# Patient Record
Sex: Male | Born: 2000 | Race: Black or African American | Hispanic: No | Marital: Single | Smoking: Never smoker
Health system: Southern US, Community
[De-identification: ages and names within clinical notes are randomized; demographics above are authoritative.]

## PROBLEM LIST (undated history)

## (undated) DIAGNOSIS — Z8709 Personal history of other diseases of the respiratory system: Secondary | ICD-10-CM

## (undated) DIAGNOSIS — S83511A Sprain of anterior cruciate ligament of right knee, initial encounter: Secondary | ICD-10-CM

---
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
## 2022-09-23 ENCOUNTER — Encounter (HOSPITAL_BASED_OUTPATIENT_CLINIC_OR_DEPARTMENT_OTHER): Payer: Self-pay | Admitting: Urology

## 2022-09-23 ENCOUNTER — Emergency Department (HOSPITAL_BASED_OUTPATIENT_CLINIC_OR_DEPARTMENT_OTHER)
Admission: EM | Admit: 2022-09-23 | Discharge: 2022-09-23 | Disposition: A | Discharge status: Home / Self Care | Payer: Medicaid Other | Attending: Emergency Medicine | Admitting: Emergency Medicine

## 2022-09-23 ENCOUNTER — Emergency Department (HOSPITAL_BASED_OUTPATIENT_CLINIC_OR_DEPARTMENT_OTHER): Payer: Medicaid Other

## 2022-09-23 ENCOUNTER — Other Ambulatory Visit: Payer: Self-pay

## 2022-09-23 DIAGNOSIS — Z5321 Procedure and treatment not carried out due to patient leaving prior to being seen by health care provider: Secondary | ICD-10-CM | POA: Diagnosis not present

## 2022-09-23 DIAGNOSIS — R1013 Epigastric pain: Secondary | ICD-10-CM | POA: Insufficient documentation

## 2022-09-23 DIAGNOSIS — E876 Hypokalemia: Secondary | ICD-10-CM | POA: Diagnosis not present

## 2022-09-23 LAB — COMPREHENSIVE METABOLIC PANEL
ALT: 21 U/L (ref 0–44)
AST: 30 U/L (ref 15–41)
Albumin: 4.8 g/dL (ref 3.5–5.0)
Alkaline Phosphatase: 55 U/L (ref 38–126)
Anion gap: 14 (ref 5–15)
BUN: 10 mg/dL (ref 6–20)
CO2: 20 mmol/L — ABNORMAL LOW (ref 22–32)
Calcium: 9.8 mg/dL (ref 8.9–10.3)
Chloride: 104 mmol/L (ref 98–111)
Creatinine, Ser: 1.15 mg/dL (ref 0.61–1.24)
GFR, Estimated: >60 mL/min (ref >60)
Glucose, Bld: 128 mg/dL — ABNORMAL HIGH (ref 70–99)
Potassium: 3.3 mmol/L — ABNORMAL LOW (ref 3.5–5.1)
Sodium: 138 mmol/L (ref 135–145)
Total Bilirubin: 0.7 mg/dL (ref 0.3–1.2)
Total Protein: 7.8 g/dL (ref 6.5–8.1)

## 2022-09-23 LAB — CBC
HCT: 42.7 % (ref 39.0–52.0)
Hemoglobin: 14.2 g/dL (ref 13.0–17.0)
MCH: 28 pg (ref 26.0–34.0)
MCHC: 33.3 g/dL (ref 30.0–36.0)
MCV: 84.1 fL (ref 80.0–100.0)
Platelets: 285 10*3/uL (ref 150–400)
RBC: 5.08 MIL/uL (ref 4.22–5.81)
RDW: 12.8 % (ref 11.5–15.5)
WBC: 11.3 10*3/uL — ABNORMAL HIGH (ref 4.0–10.5)
nRBC: 0 % (ref 0.0–0.2)

## 2022-09-23 LAB — URINALYSIS, MICROSCOPIC (REFLEX)

## 2022-09-23 LAB — URINALYSIS, ROUTINE W REFLEX MICROSCOPIC
Bilirubin Urine: NEGATIVE
Glucose, UA: NEGATIVE mg/dL
Hgb urine dipstick: NEGATIVE
Ketones, ur: >=80 mg/dL — AB
Leukocytes,Ua: NEGATIVE
Nitrite: NEGATIVE
Protein, ur: 100 mg/dL — AB
Specific Gravity, Urine: 1.02 (ref 1.005–1.030)
pH: >=9 (ref 5.0–8.0)

## 2022-09-23 LAB — MAGNESIUM: Magnesium: 1.5 mg/dL — ABNORMAL LOW (ref 1.7–2.4)

## 2022-09-23 LAB — LIPASE, BLOOD: Lipase: 26 U/L (ref 11–51)

## 2022-09-23 LAB — TROPONIN I (HIGH SENSITIVITY): Troponin I (High Sensitivity): 3 ng/L (ref <18)

## 2022-09-23 MED ORDER — HALOPERIDOL LACTATE 5 MG/ML IJ SOLN
2.0000 mg | Freq: Once | INTRAMUSCULAR | Status: AC
  Administered 2022-09-23: 2 mg via INTRAVENOUS
  Filled 2022-09-23: qty 1

## 2022-09-23 MED ORDER — POTASSIUM CHLORIDE CRYS ER 20 MEQ PO TBCR: 20.0000 meq | EXTENDED_RELEASE_TABLET | Freq: Once | ORAL | Status: DC

## 2022-09-23 MED ORDER — LACTATED RINGERS IV BOLUS
2000.0000 mL | Freq: Once | INTRAVENOUS | Status: AC
  Administered 2022-09-23: 2000 mL via INTRAVENOUS

## 2022-09-23 MED ORDER — CAPSAICIN 0.075 % EX CREA
TOPICAL_CREAM | Freq: Once | CUTANEOUS | Status: AC
  Administered 2022-09-23: 1 via TOPICAL
  Filled 2022-09-23: qty 60

## 2022-09-23 MED ORDER — POTASSIUM CHLORIDE 10 MEQ/100ML IV SOLN
10.0000 meq | INTRAVENOUS | Status: DC
  Administered 2022-09-23: 10 meq via INTRAVENOUS
  Filled 2022-09-23 (×2): qty 100

## 2022-09-23 MED ORDER — MAGNESIUM OXIDE -MG SUPPLEMENT 400 (240 MG) MG PO TABS: 400.0000 mg | ORAL_TABLET | Freq: Once | ORAL | Status: DC

## 2022-09-23 NOTE — ED Provider Notes (Signed)
I provided a substantive portion of the care of this patient.  I personally made/approved the management plan for this patient and take responsibility for the patient management.     Patient reports he had sudden onset of vomiting and abdominal pain this morning.  He reports that it hurts a lot in the central upper abdomen.  He reports he has vomited many times this morning.  Patient reports he does smoke marijuana pretty frequently.  He denies prior episode of similar symptoms.  On initial arrival patient was reportedly hyperventilating and very anxious.  At the time of my evaluation he is more calm and answering questions appropriately.  No evidence of respiratory distress.  Heart regular.  Lungs clear to auscultation.  Abdomen diffusely tender but no guarding.  No peripheral edema.  Labs reviewed show normal GFR.  Glucose 128.  Potassium 3.3.  Betker count 11.3.  At this time based on physical exam and history of symptoms I have most likely suspect cannabinoid hyperemesis and will start with aggressive rehydration, Haldol 2 mg and capsaicin to the abdomen.  Patient educated on cannabinoid hyperemesis symptoms and treatment.   Arby Barrette, MD 09/23/22 [phone]

## 2022-09-23 NOTE — ED Notes (Signed)
Pt states he is ready to go ,PA into speak to pt and make him aware of  leaving AMA, found pt standing on end of bed getting dressed IV had been unattached to saline lockand was running ito bed

## 2022-09-23 NOTE — ED Notes (Signed)
Pt coached on breathing techniques.

## 2022-09-23 NOTE — ED Notes (Signed)
Called lab, will add on Mag

## 2022-09-23 NOTE — ED Provider Notes (Signed)
[place] EMERGENCY DEPARTMENT AT [facility] Provider Note   CSN: [id number] Arrival date & time: 09/23/22  1201     History Chief Complaint  Patient presents with   Abdominal Pain    Nakoda Collard is a 22 y.o. male reportedly otherwise healthy presents emerged from today for evaluation of abdominal pain with nausea and vomiting.  Patient reports this started today around 0900.  He had Tylenol PM for the symptoms but did not have any improvement.  He has had nausea with 2 episodes of emesis.  Reports the abdominal pain is mainly epigastric and achy.  Denies any diarrhea or constipation.  Had a normal bowel movement this morning without any melena or hematochezia.  Denies any chest pain reports some shortness of breath.  Denies any cough, runny nose, nasal gesturing, or any fever.  He denies any dysuria or hematuria as well.  He denies any radiation of pain into his testicles or scrotum or penis.  He reports surgical history to his knee but denies any surgical history to his abdomen.  No daily medications.  No known drug allergies.  Denies any tobacco or EtOH use. Almost daily marijuana use.   Abdominal Pain Associated symptoms: nausea, shortness of breath and vomiting   Associated symptoms: no chest pain, no chills, no constipation, no cough, no diarrhea, no dysuria, no fever and no hematuria        Home Medications Prior to Admission medications   Medication Sig Start Date End Date Taking? Authorizing Provider  HYDROcodone-acetaminophen (NORCO) 5-325 MG tablet Take 1-2 tablets by mouth every 6 (six) hours as needed for moderate pain. Patient not taking: Reported on 06/22/2018 02/19/17   Marshia Ly, PA-C      Allergies    Patient has no known allergies.    Review of Systems   Review of Systems  Constitutional:  Negative for chills and fever.  HENT:  Negative for congestion and rhinorrhea.   Respiratory:  Positive for shortness of breath. Negative for cough.    Cardiovascular:  Negative for chest pain.  Gastrointestinal:  Positive for abdominal pain, nausea and vomiting. Negative for blood in stool, constipation and diarrhea.  Genitourinary:  Negative for dysuria, hematuria, penile discharge, penile pain, penile swelling, scrotal swelling and testicular pain.    Physical Exam Updated Vital Signs BP (!) 128/54   Pulse 77   Temp 99.4 F (37.4 C) (Rectal)   Resp 18   Ht 5\' 8"  (1.727 m)   Wt 59 kg   SpO2 100%   BMI 19.78 kg/m  Physical Exam Vitals and nursing note reviewed.  Constitutional:      Appearance: He is not toxic-appearing.     Comments: Speaking in prolonged full sentences without needing to take a breath however would pause after sentence, breath, and then hyperventilate and continue another full sentence without any breaks or signs of respiratory distress.   HENT:     Mouth/Throat:     Mouth: Mucous membranes are moist.  Eyes:     General: No scleral icterus. Cardiovascular:     Rate and Rhythm: Normal rate.  Pulmonary:     Effort: Pulmonary effort is normal. No respiratory distress.     Breath sounds: Normal breath sounds. No wheezing.  Abdominal:     General: There is no distension.     Palpations: Abdomen is soft.     Tenderness: There is abdominal tenderness in the epigastric area. There is no guarding or rebound.  Skin:  General: Skin is warm.  Neurological:     Mental Status: He is alert.     ED Results / Procedures / Treatments   Labs (all labs ordered are listed, but only abnormal results are displayed) Labs Reviewed  COMPREHENSIVE METABOLIC PANEL - Abnormal; Notable for the following components:      Result Value   Potassium 3.3 (*)    CO2 20 (*)    Glucose, Bld 128 (*)    All other components within normal limits  CBC - Abnormal; Notable for the following components:   WBC 11.3 (*)    All other components within normal limits  URINALYSIS, ROUTINE W REFLEX MICROSCOPIC - Abnormal; Notable for the  following components:   Ketones, ur >=80 (*)    Protein, ur 100 (*)    All other components within normal limits  MAGNESIUM - Abnormal; Notable for the following components:   Magnesium 1.5 (*)    All other components within normal limits  URINALYSIS, MICROSCOPIC (REFLEX) - Abnormal; Notable for the following components:   Bacteria, UA RARE (*)    All other components within normal limits  LIPASE, BLOOD  TROPONIN I (HIGH SENSITIVITY)  TROPONIN I (HIGH SENSITIVITY)    EKG None  Radiology DG Chest 2 View  Result Date: 09/23/2022 CLINICAL DATA:  Shortness of breath with nausea vomiting. EXAM: CHEST - 2 VIEW COMPARISON:  None Available. FINDINGS: The lungs are clear without focal pneumonia, edema, pneumothorax or pleural effusion. The cardiopericardial silhouette is within normal limits for size. No acute bony abnormality. IMPRESSION: No active cardiopulmonary disease. Electronically Signed   By: Kennith Center M.D.   On: 09/23/2022 15:36    Procedures Procedures   Medications Ordered in ED Medications  potassium chloride SA (KLOR-CON M) CR tablet 20 mEq (20 mEq Oral Not Given 09/23/22 1615)  magnesium oxide (MAG-OX) tablet 400 mg (400 mg Oral Not Given 09/23/22 1616)  lactated ringers bolus 2,000 mL (0 mLs Intravenous Stopped 09/23/22 1617)  haloperidol lactate (HALDOL) injection 2 mg (2 mg Intravenous Given 09/23/22 1528)  capsicum (ZOSTRIX) 0.075 % cream (1 Application Topical Given 09/23/22 1533)    ED Course/ Medical Decision Making/ A&P    Medical Decision Making Amount and/or Complexity of Data Reviewed Labs: ordered. Radiology: ordered.  Risk OTC drugs. Prescription drug management.   22 y.o. male presents to the ER for evaluation of abdominal pain, nausea, vomiting, and shortness of breath. Differential diagnosis includes but is not limited to ACS/MI, Boerhaave's, DKA, elevated ICP, Ischemic bowel, Sepsis, Drug-related (toxicity, THC hyperemesis, ETOH, withdrawal),  Appendicitis, Bowel obstruction, Electrolyte abnormalities, Pancreatitis, Biliary colic, Gastroenteritis, Gastroparesis, Hepatitis, Migraine, Thyroid disease, Renal colic, GERD/PUD, UTI, CHF, pericardial effusion/tamponade, arrhythmias, ACS, COPD, asthma, bronchitis, pneumonia, pneumothorax, PE, anemia. Vital signs BP at 128/54 otherwise unremarkable. Physical exam as noted above.   Patient complaining of SOB when in triage.  Respiratory therapist assessed at bedside.  Likely anxiety as lungs are clear.  He was speaking in full sentences and would become tachypneic and speaking full sentences again.  Medical professionals were not in the room, he was evaluated on cardiac monitoring with normal respiratory rate, oxygen, and pulse ox.  Whenever medical professionals would go into the room, he started to have the tachypnea.  He was encouraged multiple times by myself and by nursing staff on breathing techniques.  His lung sounds are clear, heart regular rate and rhythm.  Denies any chest pain.  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  Ordered Haldol, capsaicin, and LR.  EKG was reviewed and interpreted by my attending.  I independently reviewed and interpreted the patient's labs.  CMP shows decreased potassium 3 of 3, bicarb of 20 with a normal anion gap.  Glucose at 128.  No other electrolyte or LFT abnormality.  CBC does show slight increase in Kucinski blood cell count 11.3.  No anemia.  Lipase within normal limits.  Urinalysis shows concentrated urine with greater than 80 ketones and 100 protein present.  Some rare bacteria however no other signs of infection.  Magnesium low at 1.5.  Troponin at 3.  Chest x-ray shows no acute cardiopulmonary process per radiology read.  Was informed by nursing staff the patient was feeling better and wanted to go home.  He is no longer shaking or having any tachypnea.  His fluids are still running and he is only received 1 of  potassium.  I discussed with him that his potassium and magnesium are low and this is dangerous and he needs to stay to receive the potassium magnesium.  Patient reports that he was amenable to receive these.  I left the room to order them p.o. however the patient eloped.  I discussed this case with my attending physician who cosigned this note including patient's presenting symptoms, physical exam, and planned diagnostics and interventions. Attending physician stated agreement with plan or made changes to plan which were implemented.   Attending physician assessed patient at bedside.   Final Clinical Impression(s) / ED Diagnoses Final diagnoses:  Eloped from emergency department  Hypokalemia  Hypomagnesemia    Rx / DC Orders ED Discharge Orders     None         Achille Rich, PA-C 09/23/22 1845    Arby Barrette, MD 09/25/22 [phone]

## 2022-09-23 NOTE — ED Notes (Signed)
Report rec'd from prev RN 

## 2022-09-23 NOTE — ED Notes (Signed)
Pt denies any exposure to anyone with a known illness, reports smoking marijuana occasionally, but denies any other drug use; PA-C notified about temp increase; pt given blankets and heat packs by another RN, OK to warm pt per provider

## 2022-09-23 NOTE — ED Notes (Signed)
Pt states is not staying for any meds young woman with pt

## 2022-09-23 NOTE — ED Triage Notes (Signed)
Pt states Nausea and abdominal pain  States SOB that started with the pain  Pt very tachypnic, hyperventilating   Brett Canales, RT at bedside  No h/o asthma

## 2023-06-01 ENCOUNTER — Emergency Department (HOSPITAL_COMMUNITY)
Admission: EM | Admit: 2023-06-01 | Discharge: 2023-06-01 | Discharge status: Against Medical Advice | Payer: MEDICAID | Attending: Emergency Medicine | Admitting: Emergency Medicine

## 2023-06-01 ENCOUNTER — Encounter (HOSPITAL_COMMUNITY): Payer: Self-pay

## 2023-06-01 ENCOUNTER — Emergency Department (HOSPITAL_COMMUNITY): Payer: MEDICAID

## 2023-06-01 DIAGNOSIS — Z5321 Procedure and treatment not carried out due to patient leaving prior to being seen by health care provider: Secondary | ICD-10-CM | POA: Insufficient documentation

## 2023-06-01 DIAGNOSIS — R112 Nausea with vomiting, unspecified: Secondary | ICD-10-CM | POA: Diagnosis not present

## 2023-06-01 DIAGNOSIS — R1010 Upper abdominal pain, unspecified: Secondary | ICD-10-CM | POA: Insufficient documentation

## 2023-06-01 DIAGNOSIS — R197 Diarrhea, unspecified: Secondary | ICD-10-CM | POA: Insufficient documentation

## 2023-06-01 LAB — COMPREHENSIVE METABOLIC PANEL
ALT: 26 U/L (ref 0–44)
AST: 34 U/L (ref 15–41)
Albumin: 4.4 g/dL (ref 3.5–5.0)
Alkaline Phosphatase: 47 U/L (ref 38–126)
Anion gap: 11 (ref 5–15)
BUN: 5 mg/dL — ABNORMAL LOW (ref 6–20)
CO2: 21 mmol/L — ABNORMAL LOW (ref 22–32)
Calcium: 10 mg/dL (ref 8.9–10.3)
Chloride: 107 mmol/L (ref 98–111)
Creatinine, Ser: 1.12 mg/dL (ref 0.61–1.24)
GFR, Estimated: >60 mL/min (ref >60)
Glucose, Bld: 131 mg/dL — ABNORMAL HIGH (ref 70–99)
Potassium: 3.9 mmol/L (ref 3.5–5.1)
Sodium: 139 mmol/L (ref 135–145)
Total Bilirubin: 0.6 mg/dL (ref 0.0–1.2)
Total Protein: 7.4 g/dL (ref 6.5–8.1)

## 2023-06-01 LAB — CBC
HCT: 43.4 % (ref 39.0–52.0)
Hemoglobin: 14.4 g/dL (ref 13.0–17.0)
MCH: 28.3 pg (ref 26.0–34.0)
MCHC: 33.2 g/dL (ref 30.0–36.0)
MCV: 85.3 fL (ref 80.0–100.0)
Platelets: 308 10*3/uL (ref 150–400)
RBC: 5.09 MIL/uL (ref 4.22–5.81)
RDW: 13.4 % (ref 11.5–15.5)
WBC: 8 10*3/uL (ref 4.0–10.5)
nRBC: 0 % (ref 0.0–0.2)

## 2023-06-01 LAB — RESP PANEL BY RT-PCR (RSV, FLU A&B, COVID)  RVPGX2
Influenza A by PCR: NEGATIVE
Influenza B by PCR: NEGATIVE
Resp Syncytial Virus by PCR: NEGATIVE
SARS Coronavirus 2 by RT PCR: NEGATIVE

## 2023-06-01 LAB — LIPASE, BLOOD: Lipase: 23 U/L (ref 11–51)

## 2023-06-01 MED ORDER — IOHEXOL 350 MG/ML SOLN
75.0000 mL | Freq: Once | INTRAVENOUS | Status: AC | PRN
  Administered 2023-06-01: 75 mL via INTRAVENOUS

## 2023-06-01 MED ORDER — ONDANSETRON HCL 4 MG/2ML IJ SOLN
4.0000 mg | Freq: Once | INTRAMUSCULAR | Status: AC
  Administered 2023-06-01: 4 mg via INTRAVENOUS
  Filled 2023-06-01: qty 2

## 2023-06-01 NOTE — ED Notes (Signed)
Pt called for room x3 with no answer. 

## 2023-06-01 NOTE — ED Triage Notes (Signed)
 Pt c/o generalized abdominal pain and n/v/d starting this morning.  Pain score 8/10.

## 2023-06-01 NOTE — ED Provider Triage Note (Signed)
 Emergency Medicine Provider Triage Evaluation Note  Bobby Morris , a 23 y.o. male  was evaluated in triage.  Pt complains of abdominal pain.  Patient reports upper abdominal pain that began this morning.  It woke him up out of sleep.  Since then he has persistent pain, with nausea, vomiting, and diarrhea.  Denies any recent alcohol or marijuana use.  No new foods.  No fevers at home.  Review of Systems  Positive: Nausea, vomiting, abdominal pain Negative: Fevers  Physical Exam  BP (!) 127/106 (BP Location: Right Arm)   Pulse 86   Temp 99.1 F (37.3 C)   Resp (!) 32   Ht 5\' 8"  (1.727 m)   Wt 59 kg   SpO2 100%   BMI 19.77 kg/m  Gen:   Awake, anxious appearing Resp:  Normal effort  MSK:   Moves extremities without difficulty  Other:  Patient is tremulous on exam  Medical Decision Making  Medically screening exam initiated at 2:24 PM.  Appropriate orders placed.  Bobby Morris was informed that the remainder of the evaluation will be completed by another provider, this initial triage assessment does not replace that evaluation, and the importance of remaining in the ED until their evaluation is complete.     Bobby Marion, PA-C 06/01/23 1424

## 2023-06-04 ENCOUNTER — Ambulatory Visit (HOSPITAL_COMMUNITY)
Admission: EM | Admit: 2023-06-04 | Discharge: 2023-06-04 | Discharge status: Against Medical Advice | Attending: Psychiatry | Admitting: Psychiatry

## 2023-06-04 DIAGNOSIS — F411 Generalized anxiety disorder: Secondary | ICD-10-CM | POA: Insufficient documentation

## 2023-06-04 NOTE — ED Notes (Signed)
 Per triage, patient left AMA due to not wanting to stay.

## 2023-06-04 NOTE — ED Provider Notes (Signed)
 Behavioral Health Urgent Care Medical Screening Exam  Patient Name: Bobby Morris MRN: [id number] Date of Evaluation: 06/04/23 Chief Complaint:   Diagnosis:  Final diagnoses:  Anxiety state    History of Present illness: Bobby Morris is a 23 y.o. male.   Patient presents voluntarily, accompanied by his mother  Perry Brucato [phone]. Patient reports that he has been experiencing anxiety and panic attacks since Sunday. Per chart review, patient has had many visits to ED lately, complaining of panic attacks accompanied with nausea and vomiting.  On 09/23/2022, patient presented to [facility] ED complaining of  shortness of breath, abdominal discomfort, characterized by nausea and vomiting along with abdominal pain. He admitted to daily marijuana use.  Patient eloped from the facility. On 12/23/2022, patient was seen at [facility] for the same symptoms. On 12/24/2022, patient visited [facility] again for panic attacks. On 06/01/2023, patient visited [facility] with chief complaint of N/V.  Today, patient presents with the above symptoms and reports that his anxiety is becoming complicated.  He denies SI/HI/AVH.  He admits to using Marijuana and last use was Saturday.   Assessment: 23 year-old male looking underweight. He appears to be uncomfortable and frequently spitting in the trash can. He is cooperative upon approach. He is anxious and restless. He appears to be underweight and reports that he has lost about 10 lbs since Sunday due to vomiting and not eating enough.  Patient is alert and oriented x 4. His thought process is coherent and goal-directed.  Patient denies suicidal ideations. He denies HI. He reports a hx of hallucinations: heard condensed voices about a year ago when his friend died.  He reports that he has not been ale to keep anything down due to frequent vomiting. Reports that he has lost about 10 lbs since Sunday. He reports that he feels burning sensations after vomiting.  He reports  that he uses Marijuana "because I thought it was going to make it better".   Patient reports a family hx of anxiety and panic attacks: his paternal grandmother experiences panic attacks. He reports no additional family hx of mental illness.   Patient presents with  a  hx of traumatic events.  Patient has never met his biological father; was raised by step dad since age 88. In 2020, during Covid19, patient was told that his father went missing and was found dead in a river. He never knew what killed him and his case has been closed. Patient also lost his uncle who played a role in his life as a dad. He lost  cousin as well.    Patient reports that "I feel lost". He reports that his mother and step-father are supportive. He reports that he has no therapist and has never taken any anxiety medications. He reports that he is interested in starting therapy  once stabilized. Patient is educated on the effects of a long term use of Marijuana. He is encouraged to utilize alternative coping skills  and to process his feelings with therapist.    After consulting with Dr Enedina Finner, admission to observation unit was recommended. However, patient walked out reporting that "I am not ready, I will have to come back". Patient was encouraged to stay for overnight monitoring to address his anxiety but refused to stay. He refused referrals to outpatient services.      Flowsheet Row ED from 06/04/2023 in [facility] ED from 06/01/2023 in [facility] Emergency Department at [facility] ED from 09/23/2022 in  [facility] Emergency Department at [facility]  C-SSRS RISK CATEGORY No Risk No Risk No Risk       Psychiatric Specialty Exam  Presentation  General Appearance:Casual  Eye Contact:Fair  Speech:Clear and Coherent  Speech Volume:Normal  Handedness:Right   Mood and Affect  Mood: Anxious  Affect: Depressed   Thought Process  Thought  Processes: Coherent  Descriptions of Associations:Intact  Orientation:Full (Time, Place and Person)  Thought Content:Logical    Hallucinations:None  Ideas of Reference:None  Suicidal Thoughts:No  Homicidal Thoughts:No   Sensorium  Memory: Immediate Fair; Recent Fair; Remote Fair  Judgment: Fair  Insight: Fair   Art therapist  Concentration: Fair  Attention Span: Fair  Recall: [employer] of Knowledge: Fair  Language: Fair   Psychomotor Activity  Psychomotor Activity: Restlessness   Assets  Assets: Manufacturing systems engineer; Desire for Improvement; Social Support   Sleep  Sleep: Poor  Number of hours:  2   Physical Exam: Physical Exam Constitutional:      Appearance: Normal appearance. He is ill-appearing.  HENT:     Head: Normocephalic and atraumatic.     Right Ear: Tympanic membrane normal.     Left Ear: Tympanic membrane normal.     Nose: Nose normal.  Eyes:     Extraocular Movements: Extraocular movements intact.     Pupils: Pupils are equal, round, and reactive to light.  Cardiovascular:     Rate and Rhythm: Normal rate.     Pulses: Normal pulses.  Pulmonary:     Effort: Pulmonary effort is normal.  Musculoskeletal:        General: Normal range of motion.     Cervical back: Normal range of motion and neck supple.  Neurological:     General: No focal deficit present.     Mental Status: He is alert and oriented to person, place, and time.  Psychiatric:        Thought Content: Thought content normal.    Review of Systems  Constitutional: Negative.   HENT: Negative.    Eyes: Negative.   Respiratory: Negative.    Cardiovascular: Negative.   Gastrointestinal: Negative.   Genitourinary: Negative.   Musculoskeletal: Negative.   Skin: Negative.   Neurological: Negative.   Endo/Heme/Allergies: Negative.   Psychiatric/Behavioral:  The patient is nervous/anxious.    Blood pressure (!) 106/92, pulse 92, temperature 97.9 F  (36.6 C), temperature source Oral, resp. rate 17, SpO2 100%. There is no height or weight on file to calculate BMI.  Musculoskeletal: Strength & Muscle Tone: within normal limits Gait & Station: normal Patient leans: N/A   [facility] MSE Discharge Disposition for Follow up and Recommendations: Based on my evaluation the patient does not appear to have an emergency medical condition and can be discharged with resources and follow up care in outpatient services for Medication Management, Substance Abuse Intensive Outpatient Program, Individual Therapy, and Group Therapy   Olin Pia, NP 06/04/2023, 12:18 PM

## 2023-06-04 NOTE — Progress Notes (Signed)
   06/04/23 1118  [facility] Triage Screening (Walk-ins at [facility] only)  How Did You Hear About [place]? Family/Friend  What Is the Reason for Your Visit/Call Today? Bobby Morris presents to [facility] voluntarily accompanied by his mother. Pt states that he has had a panic attack since Sunday and has been unable to eat or hold down any food. Pt states that he has been drinking water and that has been staying down. Pt states that he had pain around his heart and stomach which also started on Sunday. Pt states that his hands will start cramping up and his body will cramp up when he has anxiety attacks. Pt currently denies SI, HI, AVH and alcohol/drug use. Pt states that he would just like to sleep at night and hold some food down.  How Long Has This Been Causing You Problems? <Week  Have You Recently Had Any Thoughts About Hurting Yourself? No  Are You Planning to Commit Suicide/Harm Yourself At This time? No  Have you Recently Had Thoughts About Hurting Someone [name]? No  Are You Planning To Harm Someone At This Time? No  Physical Abuse Denies  Verbal Abuse Denies  Sexual Abuse Denies  Exploitation of patient/patient's resources Denies  Self-Neglect Denies  Are you currently experiencing any auditory, visual or other hallucinations? No  Have You Used Any Alcohol or Drugs in the Past 24 Hours? No  Do you have any current medical co-morbidities that require immediate attention? Yes  Please describe current medical co-morbidities that require immediate attention: asthmatic  Clinician description of patient physical appearance/behavior: cooperative, casually dressed  What Do You Feel Would Help You the Most Today? Medication(s)  If access to [facility] was not available, would you have sought care in the Emergency Department? No  Determination of Need Routine (7 days)  Options For Referral Medication Management

## 2023-06-05 ENCOUNTER — Emergency Department (HOSPITAL_COMMUNITY)
Admission: EM | Admit: 2023-06-05 | Discharge: 2023-06-05 | Disposition: A | Discharge status: Home / Self Care | Payer: MEDICAID | Source: Home / Self Care | Attending: Emergency Medicine | Admitting: Emergency Medicine

## 2023-06-05 ENCOUNTER — Encounter (HOSPITAL_COMMUNITY): Payer: Self-pay

## 2023-06-05 ENCOUNTER — Ambulatory Visit (HOSPITAL_COMMUNITY)
Admission: EM | Admit: 2023-06-05 | Discharge: 2023-06-05 | Disposition: A | Discharge status: Other Acute Inpatient Hospital | Attending: Nurse Practitioner | Admitting: Nurse Practitioner

## 2023-06-05 ENCOUNTER — Emergency Department (HOSPITAL_COMMUNITY)
Admission: EM | Admit: 2023-06-05 | Discharge: 2023-06-05 | Disposition: A | Discharge status: Home / Self Care | Payer: MEDICAID | Attending: Emergency Medicine | Admitting: Emergency Medicine

## 2023-06-05 ENCOUNTER — Other Ambulatory Visit: Payer: Self-pay

## 2023-06-05 DIAGNOSIS — J45909 Unspecified asthma, uncomplicated: Secondary | ICD-10-CM | POA: Diagnosis not present

## 2023-06-05 DIAGNOSIS — R112 Nausea with vomiting, unspecified: Secondary | ICD-10-CM | POA: Insufficient documentation

## 2023-06-05 DIAGNOSIS — R4589 Other symptoms and signs involving emotional state: Secondary | ICD-10-CM

## 2023-06-05 DIAGNOSIS — R002 Palpitations: Secondary | ICD-10-CM | POA: Insufficient documentation

## 2023-06-05 DIAGNOSIS — R110 Nausea: Secondary | ICD-10-CM

## 2023-06-05 DIAGNOSIS — R1084 Generalized abdominal pain: Secondary | ICD-10-CM | POA: Insufficient documentation

## 2023-06-05 DIAGNOSIS — Z79899 Other long term (current) drug therapy: Secondary | ICD-10-CM | POA: Diagnosis not present

## 2023-06-05 DIAGNOSIS — R197 Diarrhea, unspecified: Secondary | ICD-10-CM | POA: Insufficient documentation

## 2023-06-05 DIAGNOSIS — F419 Anxiety disorder, unspecified: Secondary | ICD-10-CM | POA: Insufficient documentation

## 2023-06-05 DIAGNOSIS — R0602 Shortness of breath: Secondary | ICD-10-CM | POA: Insufficient documentation

## 2023-06-05 DIAGNOSIS — E876 Hypokalemia: Secondary | ICD-10-CM | POA: Diagnosis not present

## 2023-06-05 DIAGNOSIS — G4700 Insomnia, unspecified: Secondary | ICD-10-CM | POA: Insufficient documentation

## 2023-06-05 DIAGNOSIS — I10 Essential (primary) hypertension: Secondary | ICD-10-CM | POA: Insufficient documentation

## 2023-06-05 LAB — CBC
HCT: 45.8 % (ref 39.0–52.0)
Hemoglobin: 14.8 g/dL (ref 13.0–17.0)
MCH: 28 pg (ref 26.0–34.0)
MCHC: 32.3 g/dL (ref 30.0–36.0)
MCV: 86.6 fL (ref 80.0–100.0)
Platelets: 289 10*3/uL (ref 150–400)
RBC: 5.29 MIL/uL (ref 4.22–5.81)
RDW: 12.8 % (ref 11.5–15.5)
WBC: 7.1 10*3/uL (ref 4.0–10.5)
nRBC: 0 % (ref 0.0–0.2)

## 2023-06-05 LAB — COMPREHENSIVE METABOLIC PANEL
ALT: 23 U/L (ref 0–44)
ALT: 19 U/L (ref 0–44)
AST: 19 U/L (ref 15–41)
AST: 18 U/L (ref 15–41)
Albumin: 5 g/dL (ref 3.5–5.0)
Albumin: 5.1 g/dL — ABNORMAL HIGH (ref 3.5–5.0)
Alkaline Phosphatase: 41 U/L (ref 38–126)
Alkaline Phosphatase: 41 U/L (ref 38–126)
Anion gap: 13 (ref 5–15)
Anion gap: 13 (ref 5–15)
BUN: 27 mg/dL — ABNORMAL HIGH (ref 6–20)
BUN: 25 mg/dL — ABNORMAL HIGH (ref 6–20)
CO2: 26 mmol/L (ref 22–32)
CO2: 28 mmol/L (ref 22–32)
Calcium: 10.1 mg/dL (ref 8.9–10.3)
Calcium: 10.2 mg/dL (ref 8.9–10.3)
Chloride: 98 mmol/L (ref 98–111)
Chloride: 98 mmol/L (ref 98–111)
Creatinine, Ser: 1.21 mg/dL (ref 0.61–1.24)
Creatinine, Ser: 1.07 mg/dL (ref 0.61–1.24)
GFR, Estimated: >60 mL/min (ref >60)
GFR, Estimated: >60 mL/min (ref >60)
Glucose, Bld: 107 mg/dL — ABNORMAL HIGH (ref 70–99)
Glucose, Bld: 114 mg/dL — ABNORMAL HIGH (ref 70–99)
Potassium: 3.3 mmol/L — ABNORMAL LOW (ref 3.5–5.1)
Potassium: 3.9 mmol/L (ref 3.5–5.1)
Sodium: 137 mmol/L (ref 135–145)
Sodium: 139 mmol/L (ref 135–145)
Total Bilirubin: 1.4 mg/dL — ABNORMAL HIGH (ref 0.0–1.2)
Total Bilirubin: 1.4 mg/dL — ABNORMAL HIGH (ref 0.0–1.2)
Total Protein: 8.2 g/dL — ABNORMAL HIGH (ref 6.5–8.1)
Total Protein: 8.5 g/dL — ABNORMAL HIGH (ref 6.5–8.1)

## 2023-06-05 LAB — URINALYSIS, ROUTINE W REFLEX MICROSCOPIC
Bacteria, UA: NONE SEEN
Bilirubin Urine: NEGATIVE
Glucose, UA: NEGATIVE mg/dL
Hgb urine dipstick: NEGATIVE
Ketones, ur: 80 mg/dL — AB
Leukocytes,Ua: NEGATIVE
Nitrite: NEGATIVE
Protein, ur: 100 mg/dL — AB
Specific Gravity, Urine: 1.034 — ABNORMAL HIGH (ref 1.005–1.030)
pH: 6 (ref 5.0–8.0)

## 2023-06-05 LAB — CBC WITH DIFFERENTIAL/PLATELET
Abs Immature Granulocytes: 0.01 10*3/uL (ref 0.00–0.07)
Basophils Absolute: 0 10*3/uL (ref 0.0–0.1)
Basophils Relative: 0 %
Eosinophils Absolute: 0 10*3/uL (ref 0.0–0.5)
Eosinophils Relative: 1 %
HCT: 44.7 % (ref 39.0–52.0)
Hemoglobin: 14.8 g/dL (ref 13.0–17.0)
Immature Granulocytes: 0 %
Lymphocytes Relative: 43 %
Lymphs Abs: 2.9 10*3/uL (ref 0.7–4.0)
MCH: 28.2 pg (ref 26.0–34.0)
MCHC: 33.1 g/dL (ref 30.0–36.0)
MCV: 85.3 fL (ref 80.0–100.0)
Monocytes Absolute: 0.6 10*3/uL (ref 0.1–1.0)
Monocytes Relative: 9 %
Neutro Abs: 3.1 10*3/uL (ref 1.7–7.7)
Neutrophils Relative %: 47 %
Platelets: 285 10*3/uL (ref 150–400)
RBC: 5.24 MIL/uL (ref 4.22–5.81)
RDW: 12.9 % (ref 11.5–15.5)
WBC: 6.6 10*3/uL (ref 4.0–10.5)
nRBC: 0 % (ref 0.0–0.2)

## 2023-06-05 LAB — RESP PANEL BY RT-PCR (RSV, FLU A&B, COVID)  RVPGX2
Influenza A by PCR: NEGATIVE
Influenza B by PCR: NEGATIVE
Resp Syncytial Virus by PCR: NEGATIVE
SARS Coronavirus 2 by RT PCR: NEGATIVE

## 2023-06-05 LAB — LIPASE, BLOOD
Lipase: 26 U/L (ref 11–51)
Lipase: 26 U/L (ref 11–51)

## 2023-06-05 LAB — TSH: TSH: 1.087 u[IU]/mL (ref 0.350–4.500)

## 2023-06-05 MED ORDER — SODIUM CHLORIDE 0.9 % IV BOLUS
1000.0000 mL | Freq: Once | INTRAVENOUS | Status: AC
  Administered 2023-06-05: 1000 mL via INTRAVENOUS

## 2023-06-05 MED ORDER — ONDANSETRON 4 MG PO TBDP: 4.0000 mg | ORAL_TABLET | Freq: Four times a day (QID) | ORAL | 0 refills | Status: DC | PRN

## 2023-06-05 MED ORDER — PANTOPRAZOLE SODIUM 40 MG IV SOLR
40.0000 mg | Freq: Once | INTRAVENOUS | Status: AC
  Administered 2023-06-05: 40 mg via INTRAVENOUS
  Filled 2023-06-05: qty 10

## 2023-06-05 MED ORDER — DICYCLOMINE HCL 20 MG PO TABS: 20.0000 mg | ORAL_TABLET | Freq: Two times a day (BID) | ORAL | 0 refills | Status: DC

## 2023-06-05 MED ORDER — ONDANSETRON HCL 4 MG/2ML IJ SOLN
4.0000 mg | Freq: Once | INTRAMUSCULAR | Status: AC
  Administered 2023-06-05: 4 mg via INTRAVENOUS
  Filled 2023-06-05: qty 2

## 2023-06-05 MED ORDER — POTASSIUM CHLORIDE CRYS ER 20 MEQ PO TBCR
40.0000 meq | EXTENDED_RELEASE_TABLET | Freq: Once | ORAL | Status: AC
  Administered 2023-06-05: 40 meq via ORAL
  Filled 2023-06-05: qty 2

## 2023-06-05 MED ORDER — METOCLOPRAMIDE HCL 5 MG/ML IJ SOLN
10.0000 mg | INTRAMUSCULAR | Status: AC
  Administered 2023-06-05: 10 mg via INTRAVENOUS
  Filled 2023-06-05: qty 2

## 2023-06-05 MED ORDER — PANTOPRAZOLE SODIUM 20 MG PO TBEC: 20.0000 mg | DELAYED_RELEASE_TABLET | Freq: Every day | ORAL | 0 refills | Status: DC

## 2023-06-05 MED ORDER — LACTATED RINGERS IV BOLUS
2000.0000 mL | Freq: Once | INTRAVENOUS | Status: AC
  Administered 2023-06-05: 2000 mL via INTRAVENOUS

## 2023-06-05 NOTE — ED Provider Notes (Signed)
 MSE was initiated and I personally evaluated the patient and placed orders (if any) at  6:12 AM on June 05, 2023.  22 y.o. M here with N/V since Sunday.  States he feels "miserable".  He reports abdominal cramping.  Denies sick contacts.  No fever/chills.  Denies drugs or alcohol use today.  Does have hx of THC use.  Had CT on Sunday 06/01/23 at [facility] which was normal but LWBS.  Labs ordered along with RVP and IVF, reglan.  The patient appears stable so that the remainder of the MSE may be completed by another provider.    Garlon Hatchet, PA-C 06/05/23 1610    Glynn Octave, MD 06/05/23 [phone]

## 2023-06-05 NOTE — ED Notes (Signed)
 [facility] was requested to transport pt to [facility] for medical clearance

## 2023-06-05 NOTE — Progress Notes (Signed)
   06/05/23 0432  [facility] Triage Screening (Walk-ins at [facility] only)  What Is the Reason for Your Visit/Call Today? Pt presents to [facility] as a voluntary walk-in, accompanied by a friend with complaint of anxiety/panic attacks. Pt states that he has had a panic attack since Sunday and has been unable to eat or hold down any food. Pt reports he has only been able to keep down water at this time. Pt also reports stomach pain. Pt was seen at [facility] on 06/04/23 for same presentation and recommended for observation, however pt refused and left AMA/without outpatient resources.  Pt currently denies SI, HI, AVH and alcohol/drug use. Pt states that he would just like to sleep at night and hold some food down. Pt currently denies SI,HI,AVH and substance/alcohol use.  How Long Has This Been Causing You Problems? <Week  Have You Recently Had Any Thoughts About Hurting Yourself? No  Are You Planning to Commit Suicide/Harm Yourself At This time? No  Have you Recently Had Thoughts About Hurting Someone [name]? No  Are You Planning To Harm Someone At This Time? No  Physical Abuse Denies  Verbal Abuse Denies  Sexual Abuse Denies  Exploitation of patient/patient's resources Denies  Self-Neglect Denies  Are you currently experiencing any auditory, visual or other hallucinations? No  Have You Used Any Alcohol or Drugs in the Past 24 Hours? No  Do you have any current medical co-morbidities that require immediate attention? No  Clinician description of patient physical appearance/behavior: appears anxious, unable to control breathing, but cooperative  What Do You Feel Would Help You the Most Today? Treatment for Depression or other mood problem;Medication(s)  If access to [facility] Urgent [facility] was not available, would you have sought care in the Emergency Department? No  Determination of Need Routine (7 days)  Options For Referral Outpatient Therapy;Medication Management

## 2023-06-05 NOTE — Discharge Instructions (Addendum)
 Take the medications to help with nausea and vomiting.  The Zofran you were prescribed earlier is for nausea and vomiting.  The Protonix can help with acid indigestion.  You can take over-the-counter medications for sleep such as Tylenol PM or melatonin.  Sitter following up with your primary care doctor or mental health doctor if you continue to have issues with anxiety

## 2023-06-05 NOTE — ED Triage Notes (Addendum)
 Patient began vomiting today three times. Stated one time there was blood in his vomit. Was prescribed zofran. Stated he was unable to keep anything down. No diarrhea. Has generalized abdominal pain. Seen earlier for the same vomiting.

## 2023-06-05 NOTE — ED Notes (Signed)
Pt has urinal at bedside 

## 2023-06-05 NOTE — Discharge Instructions (Addendum)
 You can use the nausea medication I have prescribed up to every 6 hours as needed, you can use the other medication that I prescribed up to twice daily for cramping abdominal pain.  You can try taking over-the-counter Benadryl up to once nightly to help with sleep.  Please follow-up with the behavioral health urgent care, continue to have worsening anxiety.

## 2023-06-05 NOTE — Discharge Instructions (Signed)

## 2023-06-05 NOTE — ED Provider Notes (Signed)
 [place] EMERGENCY DEPARTMENT AT [facility] Provider Note   CSN: [id number] Arrival date & time: 06/05/23  1713     History  Chief Complaint  Patient presents with   Emesis    Bobby Morris is a 23 y.o. male.   Emesis    Patient states he has been having trouble with vomiting and diarrhea since Sunday.  Patient states he has had trouble with nausea and vomiting.  He was seen recently in the emergency room.  He had a CT scan of his abdomen pelvis on the ninth that did not show anything acute.  Patient states he has also been having some trouble with loose stools although not in a couple of days.  Patient states he continues to feel nauseated and vomiting.  He was back in the emergency room this morning for similar symptoms.  Patient states he was treated and reassured.    Records indicate that he did go to [facility] behavioral urgent care earlier today.  They sent him to the ED for evaluation.  He was evaluated and cleared from a medical standpoint.  He returned back to the ED today because he vomited after trying to eat some pineapple.  Patient also states he has been feeling anxious.  He has not been sleeping well.  He has felt his heart racing  Home Medications Prior to Admission medications   Medication Sig Start Date End Date Taking? Authorizing Provider  pantoprazole (PROTONIX) 20 MG tablet Take 1 tablet (20 mg total) by mouth daily. 06/05/23  Yes Linwood Dibbles, MD  dicyclomine (BENTYL) 20 MG tablet Take 1 tablet (20 mg total) by mouth 2 (two) times daily. 06/05/23   Prosperi, Christian H, PA-C  HYDROcodone-acetaminophen (NORCO) 5-325 MG tablet Take 1-2 tablets by mouth every 6 (six) hours as needed for moderate pain. Patient not taking: Reported on 06/22/2018 02/19/17   Marshia Ly, PA-C  ondansetron (ZOFRAN-ODT) 4 MG disintegrating tablet Take 1 tablet (4 mg total) by mouth every 6 (six) hours as needed for nausea or vomiting. 06/05/23   Prosperi, Christian  H, PA-C      Allergies    Patient has no known allergies.    Review of Systems   Review of Systems  Gastrointestinal:  Positive for vomiting.    Physical Exam Updated Vital Signs BP (!) 143/83   Pulse 96   Temp 99.3 F (37.4 C) (Oral)   Resp 19   Ht 1.727 m (5\' 8" )   Wt 52.2 kg   SpO2 100%   BMI 17.49 kg/m  Physical Exam Vitals and nursing note reviewed.  Constitutional:      General: He is not in acute distress.    Appearance: He is well-developed.  HENT:     Head: Normocephalic and atraumatic.     Right Ear: External ear normal.     Left Ear: External ear normal.  Eyes:     General: No scleral icterus.       Right eye: No discharge.        Left eye: No discharge.     Conjunctiva/sclera: Conjunctivae normal.  Neck:     Trachea: No tracheal deviation.  Cardiovascular:     Rate and Rhythm: Normal rate and regular rhythm.  Pulmonary:     Effort: Pulmonary effort is normal. No respiratory distress.     Breath sounds: Normal breath sounds. No stridor. No wheezing or rales.  Abdominal:     General: Bowel sounds are normal. There  is no distension.     Palpations: Abdomen is soft.     Tenderness: There is no abdominal tenderness. There is no guarding or rebound.  Musculoskeletal:        General: No tenderness or deformity.     Cervical back: Neck supple.  Skin:    General: Skin is warm and dry.     Findings: No rash.  Neurological:     General: No focal deficit present.     Mental Status: He is alert.     Cranial Nerves: No cranial nerve deficit, dysarthria or facial asymmetry.     Sensory: No sensory deficit.     Motor: No abnormal muscle tone or seizure activity.     Coordination: Coordination normal.  Psychiatric:        Mood and Affect: Mood normal.     ED Results / Procedures / Treatments   Labs (all labs ordered are listed, but only abnormal results are displayed) Labs Reviewed  COMPREHENSIVE METABOLIC PANEL - Abnormal; Notable for the following  components:      Result Value   Glucose, Bld 114 (*)    BUN 25 (*)    Total Protein 8.5 (*)    Albumin 5.1 (*)    Total Bilirubin 1.4 (*)    All other components within normal limits  URINALYSIS, ROUTINE W REFLEX MICROSCOPIC - Abnormal; Notable for the following components:   Specific Gravity, Urine 1.034 (*)    Ketones, ur 80 (*)    Protein, ur 100 (*)    All other components within normal limits  LIPASE, BLOOD  CBC  TSH  T4, FREE    EKG EKG Interpretation Date/Time:  Thursday June 05 2023 20:00:02 EDT Ventricular Rate:  60 PR Interval:  122 QRS Duration:  104 QT Interval:  443 QTC Calculation: 443 R Axis:   90  Text Interpretation: Sinus rhythm Borderline right axis deviation Probable left ventricular hypertrophy Baseline wander in lead(s) V3 Partial missing lead(s): V3 nonspecific t wave changes Confirmed by Linwood Dibbles [phone]) on 06/05/2023 8:44:55 PM  Radiology No results found.  Procedures Procedures    Medications Ordered in ED Medications  lactated ringers bolus 2,000 mL (2,000 mLs Intravenous New Bag/Given 06/05/23 2012)  pantoprazole (PROTONIX) injection 40 mg (40 mg Intravenous Given 06/05/23 2011)  ondansetron (ZOFRAN) injection 4 mg (4 mg Intravenous Given 06/05/23 2012)    ED Course/ Medical Decision Making/ A&P Clinical Course as of 06/05/23 2225  Thu Jun 05, 2023  1956 Urinalysis, Routine w reflex microscopic -Urine, Clean Catch(!) Urinalysis does show increased ketones.  CBC normal.  Metabolic panel shows increased BUN lipase normal.  AST ALT unremarkable.  Slight increase in bilirubin doubt clinically significant [JK]    Clinical Course User Index [JK] Linwood Dibbles, MD                                 Medical Decision Making Problems Addressed: Insomnia, unspecified type: undiagnosed new problem with uncertain prognosis Nausea and vomiting, unspecified vomiting type: acute illness or injury that poses a threat to life or bodily functions  Amount  and/or Complexity of Data Reviewed Labs: ordered. Decision-making details documented in ED Course.  Risk Prescription drug management.   Presented to the ED for evaluation of nausea and vomiting.  Patient has been seen recently for similar symptoms.  He has had CT scans and laboratory tests.  Patient's ED evaluation is reassuring.  No signs of hepatitis or pancreatitis.  Patient was not noted to be slightly tachycardic initially with his anxiety I added on a TSH level but that is unremarkable.  Patient was treated with IV fluids and is feeling much better.  Will have him start a course of antacids and antinausea medications.  Patient mention some issues with anxiety.  Unclear if this is related to his current illness or more chronic issue for him.  Encouraged outpatient follow-up with a psychiatrist or therapist if his symptoms persist.  Evaluation and diagnostic testing in the emergency department does not suggest an emergent condition requiring admission or immediate intervention beyond what has been performed at this time.  The patient is safe for discharge and has been instructed to return immediately for worsening symptoms, change in symptoms or any other concerns.         Final Clinical Impression(s) / ED Diagnoses Final diagnoses:  Nausea and vomiting, unspecified vomiting type  Insomnia, unspecified type    Rx / DC Orders ED Discharge Orders          Ordered    pantoprazole (PROTONIX) 20 MG tablet  Daily        06/05/23 2220              Linwood Dibbles, MD 06/05/23 2226

## 2023-06-05 NOTE — ED Triage Notes (Addendum)
 Patient arrived from [facility] with complaints of NV and lower abdominal pain since Sunday.

## 2023-06-05 NOTE — ED Provider Notes (Signed)
 [facility]al [facility] Medical Screening Exam  Patient Name: Bobby Morris MRN: [id number] Date of Evaluation: 06/05/23 Chief Complaint:  " I can't control my breathing".  Diagnosis:  Final diagnoses:  Anxiety about health    History of Present illness: Bobby Morris is a 23 y.o. male. With psychiatric history of anxiety/panic attacks and medical history of asthma, persistent abdominal pain with N/V/D, who presented voluntarily as a walk in to [facility], accompanied by a friend with complaints of shortness of breath, heart palpitations, nausea, vomiting, diarrhea, poor appetite, and anxiety/panic attacks since Sunday.   Patient was seen face to face by this provide and chart reviewed.  Per chart reviewed, Patient first presented to the [facility] ED [place], July 2024 with similar complaint, but eloped. Since then, patient has been in/out of ED's but usually elopes prior to complete evaluation/treatment.  Patient also presented to [facility] yesterday with similar complaints and left AMA.    On presentation, patient is ill-appearing and reports " I can't control my breathing, my heart is burning and I can feel palpitations, I keep vomiting everything, I can't eat, and its been ongoing since Sunday".  Patient is unable to identify any triggers/precipitating factors.   On evaluation, patient is alert, oriented x 3, and cooperative. Speech is clear, and coherent  Pt appears appropriately dressed for the environment. Eye contact is good. Mood is anxious, affect is congruent with mood. Thought process is coherent and thought content is WDL. Pt denies SI/HI/AVH. There is no objective indication that the patient is responding to internal stimuli. No delusions elicited during this assessment.    Flowsheet Row ED from 06/05/2023 in [facility] ED from 06/04/2023 in [facility] ED from 06/01/2023 in [facility] Emergency Department at [facility]   C-SSRS RISK CATEGORY No Risk No Risk No Risk       Psychiatric Specialty Exam  Presentation  General Appearance:Appropriate for Environment  Eye Contact:Good  Speech:Clear and Coherent  Speech Volume:Decreased  Handedness:Right   Mood and Affect  Mood: Anxious  Affect: Congruent   Thought Process  Thought Processes: Coherent  Descriptions of Associations:Intact  Orientation:Full (Time, Place and Person)  Thought Content:WDL    Hallucinations:None  Ideas of Reference:None  Suicidal Thoughts:No  Homicidal Thoughts:No   Sensorium  Memory: Immediate Fair  Judgment: Poor  Insight: Shallow   Executive Functions  Concentration: Fair  Attention Span: Fair  Recall: Fair  Fund of Knowledge: Fair  Language: Fair   Psychomotor Activity  Psychomotor Activity: Normal   Assets  Assets: Manufacturing systems engineer; Desire for Improvement   Sleep  Sleep: Poor  Number of hours:  2   Physical Exam: Physical Exam Constitutional:      Appearance: He is ill-appearing.  Cardiovascular:     Rate and Rhythm: Normal rate.  Pulmonary:     Effort: Respiratory distress present.  Neurological:     Mental Status: He is oriented to person, place, and time.  Psychiatric:        Attention and Perception: Attention and perception normal.        Mood and Affect: Mood is anxious. Affect is blunt.        Speech: Speech normal.        Behavior: Behavior is cooperative.        Thought Content: Thought content normal.    Review of Systems  Constitutional:  Positive for weight loss. Negative for fever.  Eyes:  Negative for discharge.  Respiratory:  Positive for shortness of breath. Negative for cough.   Cardiovascular:  Positive for palpitations.  Gastrointestinal:  Positive for abdominal pain, diarrhea, nausea and vomiting.  Neurological:  Negative for seizures and loss of consciousness.  Psychiatric/Behavioral:  The patient is nervous/anxious.     Blood pressure 100/70, pulse 71, temperature 97.7 F (36.5 C), resp. rate 18, SpO2 100%. There is no height or weight on file to calculate BMI.  Musculoskeletal: Strength & Muscle Tone: decreased Gait & Station:  weak Patient leans: N/A   [facility] MSE Discharge Disposition for Follow up and Recommendations: Based on my evaluation the patient appears to have an emergency medical condition for which I recommend the patient be transferred to the emergency department for further evaluation.   Patient reports ongoing nausea, vomiting, diarrhea, chest palpitations and shortness of breath since Sunday leading to worsening anxiety symptoms.  Ongoing situation off/on since July 2024 with significant weight loss.  Frequent presentations to the emergency department but patient ends up leaving AMA or eloping before completion of treatment. He denies SI/HI/AVH or paranoia. He does not meet inpatient psychiatric admission criteria or IVC criteria at this time and there is no evidence of immediate risk of harm to self or others.   Recommend discharge from the [facility] and transfer to [facility] for medical clearance and stabilization. I spoke with Dr.Rancour at [facility] and the provider has agreed to accept the patient.  Patient may return to the [facility] for psychiatric care if indicated, after medical clearance  EMTALA completed.  Mancel Bale, NP 06/05/2023, 5:29 AM

## 2023-06-05 NOTE — ED Provider Notes (Signed)
 [place] EMERGENCY DEPARTMENT AT [facility] Provider Note   CSN: [id number] Arrival date & time: 06/05/23  0541     History  Chief Complaint  Patient presents with   Abdominal Pain    Bobby Morris is a 23 y.o. male with past medical history significant for asthma, THC use who presents with concern for nausea, vomiting, abdominal pain since Sunday.  Seen at other emergency department and lab work and imaging performed but patient left prior to being seen, reports no improvement since then.  No nausea medication at home.  Patient denies current marijuana use.  Denies history of abdominal surgery.  Denies dysuria, hematuria.   Abdominal Pain      Home Medications Prior to Admission medications   Medication Sig Start Date End Date Taking? Authorizing Provider  HYDROcodone-acetaminophen (NORCO) 5-325 MG tablet Take 1-2 tablets by mouth every 6 (six) hours as needed for moderate pain. Patient not taking: Reported on 06/22/2018 02/19/17   Marshia Ly, PA-C      Allergies    Patient has no known allergies.    Review of Systems   Review of Systems  Gastrointestinal:  Positive for abdominal pain.  All other systems reviewed and are negative.   Physical Exam Updated Vital Signs BP (!) 140/99 (BP Location: Left Arm)   Pulse 63   Temp (!) 97.5 F (36.4 C) (Oral)   Resp 18   SpO2 100%  Physical Exam Vitals and nursing note reviewed.  Constitutional:      General: He is not in acute distress.    Appearance: Normal appearance.  HENT:     Head: Normocephalic and atraumatic.  Eyes:     General:        Right eye: No discharge.        Left eye: No discharge.  Cardiovascular:     Rate and Rhythm: Normal rate and regular rhythm.     Heart sounds: No murmur heard.    No friction rub. No gallop.  Pulmonary:     Effort: Pulmonary effort is normal.     Breath sounds: Normal breath sounds.  Abdominal:     General: Bowel sounds are normal.     Palpations:  Abdomen is soft.     Comments: Diffuse ttp in epigastric region, no rebound, rigidity, guarding throughout abdomen  Skin:    General: Skin is warm and dry.     Capillary Refill: Capillary refill takes less than 2 seconds.  Neurological:     Mental Status: He is alert and oriented to person, place, and time.  Psychiatric:        Mood and Affect: Mood normal.        Behavior: Behavior normal.     ED Results / Procedures / Treatments   Labs (all labs ordered are listed, but only abnormal results are displayed) Labs Reviewed  COMPREHENSIVE METABOLIC PANEL - Abnormal; Notable for the following components:      Result Value   Potassium 3.3 (*)    Glucose, Bld 107 (*)    BUN 27 (*)    Total Protein 8.2 (*)    Total Bilirubin 1.4 (*)    All other components within normal limits  RESP PANEL BY RT-PCR (RSV, FLU A&B, COVID)  [id number]  CBC WITH DIFFERENTIAL/PLATELET  LIPASE, BLOOD  URINALYSIS, ROUTINE W REFLEX MICROSCOPIC    EKG None  Radiology No results found.  Procedures Procedures    Medications Ordered in ED Medications  potassium chloride SA (  KLOR-CON M) CR tablet 40 mEq (has no administration in time range)  sodium chloride 0.9 % bolus 1,000 mL (1,000 mLs Intravenous New Bag/Given 06/05/23 0625)  metoCLOPramide (REGLAN) injection 10 mg (10 mg Intravenous Given 06/05/23 1610)    ED Course/ Medical Decision Making/ A&P                                 Medical Decision Making Amount and/or Complexity of Data Reviewed Labs: ordered.  Risk Prescription drug management.   This patient is a 23 y.o. male  who presents to the ED for concern of abdominal pain.   Differential diagnoses prior to evaluation: The emergent differential diagnosis includes, but is not limited to,  The causes of generalized abdominal pain include but are not limited to AAA, mesenteric ischemia, appendicitis, diverticulitis, DKA, gastritis, gastroenteritis, AMI, nephrolithiasis, pancreatitis,  peritonitis, adrenal insufficiency,lead poisoning, iron toxicity, intestinal ischemia, constipation, UTI,SBO/LBO, splenic rupture, biliary disease, IBD, IBS, PUD, or hepatitis . This is not an exhaustive differential.   Past Medical History / Co-morbidities / Social History: Asthma, anxiety, marijuana use   Additional history: Chart reviewed. Pertinent results include: Reviewed lab work, imaging from previous ED visit on 3/9 when patient left without being seen, normal CT of the abdomen, unremarkable lab work overall at this time.  Physical Exam: Physical exam performed. The pertinent findings include: Some hypertension, blood pressure 140/99, Diffuse ttp in epigastric region, no rebound, rigidity, guarding throughout abdomen  Lab Tests/Imaging studies: I personally interpreted labs/imaging and the pertinent results include:  cbc unremarkable, CMP with mild hypokalemia, potassium replaced orally, mildly elevated bilirubin likely secondary to dehydration from vomiting, but patient rehydrated in the ED.  Reviewed CT from 4 days ago with no acute intra-abdominal findings. I agree with the radiologist interpretation.  Medications: I ordered medication including reglan, bolus.  I have reviewed the patients home medicines and have made adjustments as needed.   Disposition: After consideration of the diagnostic results and the patients response to treatment, I feel that patient feeling improved, will plan to discharge with Zofran, Bentyl, encouraged marijuana cessation, patient discharged in stable condition at this time.   emergency department workup does not suggest an emergent condition requiring admission or immediate intervention beyond what has been performed at this time. The plan is: as above. The patient is safe for discharge and has been instructed to return immediately for worsening symptoms, change in symptoms or any other concerns.  Final Clinical Impression(s) / ED Diagnoses Final  diagnoses:  Generalized abdominal pain  Nausea    Rx / DC Orders ED Discharge Orders     None         [place] 06/05/23 0720    Glynn Octave, MD 06/05/23 [phone]

## 2023-06-05 NOTE — ED Notes (Signed)
 Report was given to [facility] RN charge [facility]

## 2023-06-06 LAB — T4, FREE: Free T4: 1.2 ng/dL — ABNORMAL HIGH (ref 0.61–1.12)

## 2023-11-09 ENCOUNTER — Emergency Department (HOSPITAL_COMMUNITY): Payer: MEDICAID

## 2023-11-09 ENCOUNTER — Encounter (HOSPITAL_COMMUNITY): Payer: Self-pay

## 2023-11-09 ENCOUNTER — Other Ambulatory Visit: Payer: Self-pay

## 2023-11-09 ENCOUNTER — Emergency Department (HOSPITAL_COMMUNITY)
Admission: EM | Admit: 2023-11-09 | Discharge: 2023-11-09 | Disposition: A | Discharge status: Home / Self Care | Payer: MEDICAID | Attending: Emergency Medicine | Admitting: Emergency Medicine

## 2023-11-09 DIAGNOSIS — R112 Nausea with vomiting, unspecified: Secondary | ICD-10-CM | POA: Insufficient documentation

## 2023-11-09 DIAGNOSIS — R7989 Other specified abnormal findings of blood chemistry: Secondary | ICD-10-CM | POA: Diagnosis not present

## 2023-11-09 DIAGNOSIS — R0789 Other chest pain: Secondary | ICD-10-CM | POA: Diagnosis not present

## 2023-11-09 LAB — URINALYSIS, ROUTINE W REFLEX MICROSCOPIC
Bilirubin Urine: NEGATIVE
Glucose, UA: NEGATIVE mg/dL
Hgb urine dipstick: NEGATIVE
Ketones, ur: 20 mg/dL — AB
Leukocytes,Ua: NEGATIVE
Nitrite: NEGATIVE
Protein, ur: 100 mg/dL — AB
Specific Gravity, Urine: 1.033 — ABNORMAL HIGH (ref 1.005–1.030)
pH: 6 (ref 5.0–8.0)

## 2023-11-09 LAB — COMPREHENSIVE METABOLIC PANEL WITH GFR
ALT: 25 U/L (ref 0–44)
AST: 20 U/L (ref 15–41)
Albumin: 5.2 g/dL — ABNORMAL HIGH (ref 3.5–5.0)
Alkaline Phosphatase: 44 U/L (ref 38–126)
Anion gap: 18 — ABNORMAL HIGH (ref 5–15)
BUN: 28 mg/dL — ABNORMAL HIGH (ref 6–20)
CO2: 27 mmol/L (ref 22–32)
Calcium: 10.5 mg/dL — ABNORMAL HIGH (ref 8.9–10.3)
Chloride: 98 mmol/L (ref 98–111)
Creatinine, Ser: 1.2 mg/dL (ref 0.61–1.24)
GFR, Estimated: >60 mL/min (ref >60)
Glucose, Bld: 108 mg/dL — ABNORMAL HIGH (ref 70–99)
Potassium: 3.5 mmol/L (ref 3.5–5.1)
Sodium: 143 mmol/L (ref 135–145)
Total Bilirubin: 1.3 mg/dL — ABNORMAL HIGH (ref 0.0–1.2)
Total Protein: 8.8 g/dL — ABNORMAL HIGH (ref 6.5–8.1)

## 2023-11-09 LAB — CBC WITH DIFFERENTIAL/PLATELET
Abs Immature Granulocytes: 0.01 K/uL (ref 0.00–0.07)
Basophils Absolute: 0 K/uL (ref 0.0–0.1)
Basophils Relative: 0 %
Eosinophils Absolute: 0 K/uL (ref 0.0–0.5)
Eosinophils Relative: 0 %
HCT: 45.6 % (ref 39.0–52.0)
Hemoglobin: 15.1 g/dL (ref 13.0–17.0)
Immature Granulocytes: 0 %
Lymphocytes Relative: 38 %
Lymphs Abs: 1.9 K/uL (ref 0.7–4.0)
MCH: 27.9 pg (ref 26.0–34.0)
MCHC: 33.1 g/dL (ref 30.0–36.0)
MCV: 84.1 fL (ref 80.0–100.0)
Monocytes Absolute: 0.4 K/uL (ref 0.1–1.0)
Monocytes Relative: 8 %
Neutro Abs: 2.7 K/uL (ref 1.7–7.7)
Neutrophils Relative %: 54 %
Platelets: 290 K/uL (ref 150–400)
RBC: 5.42 MIL/uL (ref 4.22–5.81)
RDW: 12.2 % (ref 11.5–15.5)
WBC: 5 K/uL (ref 4.0–10.5)
nRBC: 0 % (ref 0.0–0.2)

## 2023-11-09 LAB — TROPONIN I (HIGH SENSITIVITY): Troponin I (High Sensitivity): 3 ng/L (ref <18)

## 2023-11-09 LAB — LIPASE, BLOOD: Lipase: 29 U/L (ref 11–51)

## 2023-11-09 MED ORDER — LACTATED RINGERS IV BOLUS
1000.0000 mL | Freq: Once | INTRAVENOUS | Status: AC
  Administered 2023-11-09: 1000 mL via INTRAVENOUS

## 2023-11-09 MED ORDER — ONDANSETRON HCL 4 MG/2ML IJ SOLN
4.0000 mg | Freq: Once | INTRAMUSCULAR | Status: AC
  Administered 2023-11-09: 4 mg via INTRAVENOUS
  Filled 2023-11-09: qty 2

## 2023-11-09 MED ORDER — ONDANSETRON 8 MG PO TBDP: 8.0000 mg | ORAL_TABLET | Freq: Three times a day (TID) | ORAL | 0 refills | Status: DC | PRN

## 2023-11-09 NOTE — Discharge Instructions (Signed)
 Take the meds as needed for nausea and vomiting.  Follow-up with your primary care doctor to be rechecked if you have any recurrence of symptoms.  Return to the ED for fever or other concerning symptoms

## 2023-11-09 NOTE — ED Provider Notes (Signed)
 [place] EMERGENCY DEPARTMENT AT [facility] Provider Note   CSN: [id number] Arrival date & time: 11/09/23  1200     Patient presents with: Abdominal Pain, Emesis, and Panic Attack   Bobby Morris is a 23 y.o. male.    Abdominal Pain Associated symptoms: vomiting   Emesis Associated symptoms: abdominal pain      Patient has a history of nausea vomiting anxiety.  Patient states he started feeling anxious on Tuesday.  Since that time he has had recurrent episodes of nausea and vomiting.  Patient states he has not been able to keep anything down.  Patient states that he has been told his nausea and vomiting is related to anxiety.  Patient denies any fevers.  He also states he is having some discomfort in his chest.  He denies any history of heart disease.  Patient was seen at another emergency department on August 14.  Patient states he has been taking antacids including Zofran  and pantoprazole  and dicyclomine  without relief  Prior to Admission medications   Medication Sig Start Date End Date Taking? Authorizing Provider  dicyclomine  (BENTYL ) 20 MG tablet Take 1 tablet (20 mg total) by mouth 2 (two) times daily. 06/05/23   Prosperi, Christian H, PA-C  HYDROcodone -acetaminophen  (NORCO) 5-325 MG tablet Take 1-2 tablets by mouth every 6 (six) hours as needed for moderate pain. Patient not taking: Reported on 06/22/2018 02/19/17   Rondall Agent, PA-C  ondansetron  (ZOFRAN -ODT) 8 MG disintegrating tablet Take 1 tablet (8 mg total) by mouth every 8 (eight) hours as needed for nausea or vomiting. 11/09/23   Randol Simmonds, MD  pantoprazole  (PROTONIX ) 20 MG tablet Take 1 tablet (20 mg total) by mouth daily. 06/05/23   Randol Simmonds, MD    Allergies: Patient has no known allergies.    Review of Systems  Gastrointestinal:  Positive for abdominal pain and vomiting.    Updated Vital Signs BP 128/87 (BP Location: Right Arm) Comment: Simultaneous filing. User may not have seen previous data.  Comment (BP Location): Simultaneous filing. User may not have seen previous data.  Pulse 72 Comment: Simultaneous filing. User may not have seen previous data.  Temp 98.5 F (36.9 C) (Oral)   Resp 18 Comment: Simultaneous filing. User may not have seen previous data.  Ht 1.727 m (5' 8) Comment: Simultaneous filing. User may not have seen previous data.  Wt 52.2 kg Comment: Simultaneous filing. User may not have seen previous data.  SpO2 99% Comment: Simultaneous filing. User may not have seen previous data.  BMI 17.49 kg/m   Physical Exam Vitals and nursing note reviewed.  Constitutional:      General: He is not in acute distress.    Appearance: He is well-developed.  HENT:     Head: Normocephalic and atraumatic.     Right Ear: External ear normal.     Left Ear: External ear normal.  Eyes:     General: No scleral icterus.       Right eye: No discharge.        Left eye: No discharge.     Conjunctiva/sclera: Conjunctivae normal.  Neck:     Trachea: No tracheal deviation.  Cardiovascular:     Rate and Rhythm: Normal rate and regular rhythm.  Pulmonary:     Effort: Pulmonary effort is normal. No respiratory distress.     Breath sounds: Normal breath sounds. No stridor. No wheezing or rales.  Abdominal:     General: Bowel sounds are normal. There is  no distension.     Palpations: Abdomen is soft.     Tenderness: There is no abdominal tenderness. There is no guarding or rebound.  Musculoskeletal:        General: No tenderness or deformity.     Cervical back: Neck supple.  Skin:    General: Skin is warm and dry.     Findings: No rash.  Neurological:     General: No focal deficit present.     Mental Status: He is alert.     Cranial Nerves: No cranial nerve deficit, dysarthria or facial asymmetry.     Sensory: No sensory deficit.     Motor: No abnormal muscle tone or seizure activity.     Coordination: Coordination normal.  Psychiatric:        Mood and Affect: Mood normal.      (all labs ordered are listed, but only abnormal results are displayed) Labs Reviewed  COMPREHENSIVE METABOLIC PANEL WITH GFR - Abnormal; Notable for the following components:      Result Value   Glucose, Bld 108 (*)    BUN 28 (*)    Calcium 10.5 (*)    Total Protein 8.8 (*)    Albumin 5.2 (*)    Total Bilirubin 1.3 (*)    Anion gap 18 (*)    All other components within normal limits  URINALYSIS, ROUTINE W REFLEX MICROSCOPIC - Abnormal; Notable for the following components:   Color, Urine AMBER (*)    Specific Gravity, Urine 1.033 (*)    Ketones, ur 20 (*)    Protein, ur 100 (*)    Bacteria, UA RARE (*)    All other components within normal limits  LIPASE, BLOOD  CBC WITH DIFFERENTIAL/PLATELET  TROPONIN I (HIGH SENSITIVITY)    EKG: EKG Interpretation Date/Time:  Sunday November 09 2023 12:33:20 EDT Ventricular Rate:  67 PR Interval:  134 QRS Duration:  90 QT Interval:  385 QTC Calculation: 407 R Axis:   89  Text Interpretation: Sinus rhythm Atrial premature complex Borderline ST elevation, anterior leads No significant change since last tracing Confirmed by Randol Simmonds [phone]) on 11/09/2023 1:12:40 PM  Radiology: [facility] Chest 2 View Result Date: 11/09/2023 CLINICAL DATA:  Chest and abdominal pain, vomiting EXAM: CHEST - 2 VIEW COMPARISON:  09/23/2022 FINDINGS: The heart size and mediastinal contours are within normal limits. Both lungs are clear. The visualized skeletal structures are unremarkable. IMPRESSION: No active cardiopulmonary disease. Electronically Signed   By: Ozell Daring M.D.   On: 11/09/2023 13:09     Procedures   Medications Ordered in the ED  lactated ringers  bolus 1,000 mL (1,000 mLs Intravenous New Bag/Given 11/09/23 1349)  ondansetron  (ZOFRAN ) injection 4 mg (4 mg Intravenous Given 11/09/23 1345)    Clinical Course as of 11/09/23 1432  Sun Nov 09, 2023  1335 Urinalysis, Routine w reflex microscopic -Urine, Clean Catch(!) Urinalysis without signs  of infection.  CBC normal.  Metabolic panel shows increased BUN, similar to previous values.  Anion gap is increased covid.  Troponin normal [JK]  1335 Chest x-ray without acute findings. [JK]    Clinical Course User Index [JK] Randol Simmonds, MD                                 Medical Decision Making Problems Addressed: Nausea and vomiting, unspecified vomiting type: acute illness or injury that poses a threat to life or bodily functions  Amount  and/or Complexity of Data Reviewed Labs: ordered. Decision-making details documented in ED Course. Radiology: ordered and independent interpretation performed.  Risk Prescription drug management.   Patient presented to ED with complaints of nausea vomiting.  Patient reported that his symptoms were related to anxiety which she has had in the past.  Patient's laboratory test do not show any signs of leukocytosis.  No anemia.  Patient's BUN was slightly elevated and he did have an increased anion gap.  Suspect this was related to dehydration as urine is concentrated.  Patient was treated with IV fluids and antibiotics.  Symptoms improved.  Patient actually wanted to leave for completing his fluid bolus because his ride was here.  Low suspicion for obstruction pancreatitis hepatitis appendicitis or other serious etiology.  Will discharge home with prescription for antinausea medication     Final diagnoses:  Nausea and vomiting, unspecified vomiting type    ED Discharge Orders          Ordered    ondansetron  (ZOFRAN -ODT) 8 MG disintegrating tablet  Every 8 hours PRN        11/09/23 1429               Randol Simmonds, MD 11/09/23 1432

## 2023-11-09 NOTE — ED Triage Notes (Signed)
 Patient reports started having anxiety on Tuesday and has been vomiting since and not able to keep anything down.  Reports abd pain.  Also reports inability to sleep.

## 2023-11-19 ENCOUNTER — Emergency Department (HOSPITAL_COMMUNITY): Payer: MEDICAID

## 2023-11-19 ENCOUNTER — Observation Stay (HOSPITAL_COMMUNITY)
Admission: EM | Admit: 2023-11-19 | Discharge: 2023-11-20 | Disposition: A | Discharge status: Home / Self Care | Payer: MEDICAID | Attending: Internal Medicine | Admitting: Internal Medicine

## 2023-11-19 ENCOUNTER — Other Ambulatory Visit: Payer: Self-pay

## 2023-11-19 ENCOUNTER — Encounter (HOSPITAL_COMMUNITY): Payer: Self-pay

## 2023-11-19 DIAGNOSIS — I4581 Long QT syndrome: Secondary | ICD-10-CM | POA: Diagnosis not present

## 2023-11-19 DIAGNOSIS — I5A Non-ischemic myocardial injury (non-traumatic): Secondary | ICD-10-CM | POA: Insufficient documentation

## 2023-11-19 DIAGNOSIS — E119 Type 2 diabetes mellitus without complications: Secondary | ICD-10-CM | POA: Insufficient documentation

## 2023-11-19 DIAGNOSIS — N179 Acute kidney failure, unspecified: Principal | ICD-10-CM | POA: Diagnosis present

## 2023-11-19 DIAGNOSIS — R9431 Abnormal electrocardiogram [ECG] [EKG]: Secondary | ICD-10-CM | POA: Diagnosis present

## 2023-11-19 DIAGNOSIS — E871 Hypo-osmolality and hyponatremia: Secondary | ICD-10-CM | POA: Diagnosis not present

## 2023-11-19 DIAGNOSIS — I10 Essential (primary) hypertension: Secondary | ICD-10-CM | POA: Diagnosis not present

## 2023-11-19 DIAGNOSIS — R112 Nausea with vomiting, unspecified: Principal | ICD-10-CM | POA: Diagnosis present

## 2023-11-19 DIAGNOSIS — R627 Adult failure to thrive: Secondary | ICD-10-CM | POA: Diagnosis not present

## 2023-11-19 DIAGNOSIS — E876 Hypokalemia: Secondary | ICD-10-CM | POA: Diagnosis not present

## 2023-11-19 DIAGNOSIS — E860 Dehydration: Secondary | ICD-10-CM | POA: Diagnosis present

## 2023-11-19 LAB — CBC
HCT: 49.3 % (ref 39.0–52.0)
Hemoglobin: 17.4 g/dL — ABNORMAL HIGH (ref 13.0–17.0)
MCH: 28.7 pg (ref 26.0–34.0)
MCHC: 35.3 g/dL (ref 30.0–36.0)
MCV: 81.4 fL (ref 80.0–100.0)
Platelets: 392 K/uL (ref 150–400)
RBC: 6.06 MIL/uL — ABNORMAL HIGH (ref 4.22–5.81)
RDW: 11.7 % (ref 11.5–15.5)
WBC: 8.3 K/uL (ref 4.0–10.5)
nRBC: 0 % (ref 0.0–0.2)

## 2023-11-19 LAB — URINE DRUG SCREEN
Amphetamines: NEGATIVE
Barbiturates: NEGATIVE
Benzodiazepines: NEGATIVE
Cocaine: NEGATIVE
Fentanyl: NEGATIVE
Methadone Scn, Ur: NEGATIVE
Opiates: NEGATIVE
Tetrahydrocannabinol: POSITIVE — AB

## 2023-11-19 LAB — URINALYSIS, COMPLETE (UACMP) WITH MICROSCOPIC
Bacteria, UA: NONE SEEN
Bilirubin Urine: NEGATIVE
Glucose, UA: NEGATIVE mg/dL
Hgb urine dipstick: NEGATIVE
Ketones, ur: NEGATIVE mg/dL
Leukocytes,Ua: NEGATIVE
Nitrite: NEGATIVE
Protein, ur: 100 mg/dL — AB
Specific Gravity, Urine: 1.026 (ref 1.005–1.030)
pH: 5 (ref 5.0–8.0)

## 2023-11-19 LAB — COMPREHENSIVE METABOLIC PANEL WITH GFR
ALT: 17 U/L (ref 0–44)
AST: 24 U/L (ref 15–41)
Albumin: 5.4 g/dL — ABNORMAL HIGH (ref 3.5–5.0)
Alkaline Phosphatase: 56 U/L (ref 38–126)
Anion gap: 19 — ABNORMAL HIGH (ref 5–15)
BUN: 48 mg/dL — ABNORMAL HIGH (ref 6–20)
CO2: 32 mmol/L (ref 22–32)
Calcium: 10.4 mg/dL — ABNORMAL HIGH (ref 8.9–10.3)
Chloride: 79 mmol/L — ABNORMAL LOW (ref 98–111)
Creatinine, Ser: 1.84 mg/dL — ABNORMAL HIGH (ref 0.61–1.24)
GFR, Estimated: 53 mL/min — ABNORMAL LOW (ref >60)
Glucose, Bld: 162 mg/dL — ABNORMAL HIGH (ref 70–99)
Potassium: 2.9 mmol/L — ABNORMAL LOW (ref 3.5–5.1)
Sodium: 130 mmol/L — ABNORMAL LOW (ref 135–145)
Total Bilirubin: 1.2 mg/dL (ref 0.0–1.2)
Total Protein: 8.5 g/dL — ABNORMAL HIGH (ref 6.5–8.1)

## 2023-11-19 LAB — ETHANOL: Alcohol, Ethyl (B): <15 mg/dL (ref <15)

## 2023-11-19 LAB — MAGNESIUM: Magnesium: 3.5 mg/dL — ABNORMAL HIGH (ref 1.7–2.4)

## 2023-11-19 LAB — HIV ANTIBODY (ROUTINE TESTING W REFLEX): HIV Screen 4th Generation wRfx: NONREACTIVE

## 2023-11-19 MED ORDER — SODIUM CHLORIDE 0.9% FLUSH
3.0000 mL | Freq: Two times a day (BID) | INTRAVENOUS | Status: DC
  Administered 2023-11-19: 3 mL via INTRAVENOUS

## 2023-11-19 MED ORDER — ALBUTEROL SULFATE (2.5 MG/3ML) 0.083% IN NEBU: 2.5000 mg | INHALATION_SOLUTION | RESPIRATORY_TRACT | Status: DC | PRN

## 2023-11-19 MED ORDER — POTASSIUM CHLORIDE IN NACL 40-0.9 MEQ/L-% IV SOLN
INTRAVENOUS | Status: DC
  Filled 2023-11-19 (×3): qty 1000

## 2023-11-19 MED ORDER — SORBITOL 70 % SOLN: 30.0000 mL | Freq: Every day | Status: DC | PRN

## 2023-11-19 MED ORDER — PANTOPRAZOLE SODIUM 40 MG IV SOLR: 40.0000 mg | INTRAVENOUS | Status: DC

## 2023-11-19 MED ORDER — POTASSIUM CHLORIDE CRYS ER 10 MEQ PO TBCR
40.0000 meq | EXTENDED_RELEASE_TABLET | Freq: Once | ORAL | Status: AC
  Administered 2023-11-19: 40 meq via ORAL
  Filled 2023-11-19: qty 4

## 2023-11-19 MED ORDER — ACETAMINOPHEN 325 MG PO TABS: 650.0000 mg | ORAL_TABLET | Freq: Four times a day (QID) | ORAL | Status: DC | PRN

## 2023-11-19 MED ORDER — LACTATED RINGERS IV BOLUS: 1000.0000 mL | Freq: Once | INTRAVENOUS | Status: DC

## 2023-11-19 MED ORDER — MAGNESIUM SULFATE 2 GM/50ML IV SOLN
2.0000 g | Freq: Once | INTRAVENOUS | Status: AC
  Administered 2023-11-19: 2 g via INTRAVENOUS

## 2023-11-19 MED ORDER — SODIUM CHLORIDE 0.9 % IV SOLN: INTRAVENOUS | Status: DC

## 2023-11-19 MED ORDER — SENNOSIDES-DOCUSATE SODIUM 8.6-50 MG PO TABS: 1.0000 | ORAL_TABLET | Freq: Every evening | ORAL | Status: DC | PRN

## 2023-11-19 MED ORDER — ENOXAPARIN SODIUM 30 MG/0.3ML IJ SOSY: 30.0000 mg | PREFILLED_SYRINGE | INTRAMUSCULAR | Status: DC

## 2023-11-19 MED ORDER — MAGNESIUM SULFATE 50 % IJ SOLN
2.0000 g | Freq: Once | INTRAMUSCULAR | Status: DC
  Filled 2023-11-19: qty 4

## 2023-11-19 MED ORDER — PANTOPRAZOLE SODIUM 40 MG IV SOLR
40.0000 mg | Freq: Two times a day (BID) | INTRAVENOUS | Status: DC
  Administered 2023-11-19 – 2023-11-20 (×2): 40 mg via INTRAVENOUS
  Filled 2023-11-19 (×2): qty 10

## 2023-11-19 MED ORDER — HYDRALAZINE HCL 20 MG/ML IJ SOLN: 5.0000 mg | Freq: Four times a day (QID) | INTRAMUSCULAR | Status: DC | PRN

## 2023-11-19 MED ORDER — POTASSIUM CHLORIDE CRYS ER 20 MEQ PO TBCR
40.0000 meq | EXTENDED_RELEASE_TABLET | Freq: Once | ORAL | Status: AC
  Administered 2023-11-19: 40 meq via ORAL
  Filled 2023-11-19: qty 2

## 2023-11-19 MED ORDER — LACTATED RINGERS IV BOLUS
1000.0000 mL | Freq: Once | INTRAVENOUS | Status: AC
  Administered 2023-11-19: 1000 mL via INTRAVENOUS

## 2023-11-19 MED ORDER — LORAZEPAM 2 MG/ML IJ SOLN: 1.0000 mg | Freq: Four times a day (QID) | INTRAMUSCULAR | Status: DC | PRN

## 2023-11-19 MED ORDER — LORAZEPAM 2 MG/ML IJ SOLN
2.0000 mg | Freq: Once | INTRAMUSCULAR | Status: AC
  Administered 2023-11-19: 2 mg via INTRAVENOUS
  Filled 2023-11-19: qty 1

## 2023-11-19 MED ORDER — ACETAMINOPHEN 650 MG RE SUPP: 650.0000 mg | Freq: Four times a day (QID) | RECTAL | Status: DC | PRN

## 2023-11-19 NOTE — ED Triage Notes (Signed)
 Pt presents to ED from home accompanied by girlfriend who is concerned because patient has not eaten in almost 2 weeks. Pt refusing to talk in triage or answer questions for himself. When asked questions, patient just points to his girlfriend, who answers for him. Last recorded weight 115 lb, today weighed at 105 lb with clothes on.

## 2023-11-19 NOTE — ED Notes (Signed)
 Pt stated that he will provide a U/A once able to.

## 2023-11-19 NOTE — ED Notes (Signed)
 Provider at bedside

## 2023-11-19 NOTE — ED Provider Notes (Signed)
 [place] EMERGENCY DEPARTMENT AT [facility] Provider Note   CSN: [id number] Arrival date & time: 11/19/23  1141     History Chief Complaint  Patient presents with   Failure To Thrive    HPI HPI: Bobby Morris is a 23 y.o. male with history pertinent anxiety, nausea, vomiting who presents complaining of persistent nausea and vomiting. Patient arrived via POV accompanied by girlfriend.  History provided by patient and partner.  No interpreter required during this encounter.  Patient presents for persistent vomiting.  Reports that he has had near daily nausea and vomiting for several weeks.  Reports that he initially weighed approximately 130 pounds approximately 6 weeks to 2 months ago.  Over the past 2 to 3 weeks he decreased from 115 pounds to 105 pounds, and reports that he has not been able to tolerate solid intake.  Reports that when he attempts to take solid p.o. he has persistent emesis.  Reports that he has been able to tolerate water and popsicles.  He has been taking Zofran  prescribed to him by his PCP without improvement.  Denies alcohol, tobacco, recreational drug use, however does note that warm showers partially improve his nausea and vomiting.  Patient denies depression, suicidal ideation, homicidal ideation.  Patient presented today due to degree of his weight loss.  Patient's recorded medical, surgical, social, medication list and allergies were reviewed in the Snapshot window as part of the initial history.   Prior to Admission medications   Medication Sig Start Date End Date Taking? Authorizing Provider  dicyclomine  (BENTYL ) 20 MG tablet Take 1 tablet (20 mg total) by mouth 2 (two) times daily. 06/05/23   Prosperi, Christian H, PA-C  HYDROcodone -acetaminophen  (NORCO) 5-325 MG tablet Take 1-2 tablets by mouth every 6 (six) hours as needed for moderate pain. Patient not taking: Reported on 06/22/2018 02/19/17   Rondall Agent, PA-C  ondansetron  (ZOFRAN -ODT) 8 MG  disintegrating tablet Take 1 tablet (8 mg total) by mouth every 8 (eight) hours as needed for nausea or vomiting. 11/09/23   Randol Simmonds, MD  pantoprazole  (PROTONIX ) 20 MG tablet Take 1 tablet (20 mg total) by mouth daily. 06/05/23   Randol Simmonds, MD     Allergies: Patient has no known allergies.   Review of Systems   ROS as per HPI  Physical Exam Updated Vital Signs BP 132/85 (BP Location: Right Arm)   Pulse 83   Temp 98.1 F (36.7 C) (Oral)   Resp 14   Ht 5' 8 (1.727 m)   Wt 47.9 kg   SpO2 97%   BMI 16.04 kg/m  Physical Exam Vitals and nursing note reviewed.  Constitutional:      General: He is not in acute distress.    Comments: thin appearing  HENT:     Head: Normocephalic and atraumatic.  Eyes:     Extraocular Movements: Extraocular movements intact.  Cardiovascular:     Rate and Rhythm: Normal rate and regular rhythm.     Pulses: Normal pulses.     Heart sounds: Normal heart sounds.  Pulmonary:     Effort: Pulmonary effort is normal.     Breath sounds: Normal breath sounds.  Abdominal:     General: Abdomen is scaphoid. Bowel sounds are normal.     Palpations: Abdomen is soft.     Tenderness: There is no guarding or rebound.  Skin:    General: Skin is warm and dry.     Capillary Refill: Capillary refill takes less than 2  seconds.  Neurological:     General: No focal deficit present.     Mental Status: He is alert and oriented to person, place, and time.  Psychiatric:        Mood and Affect: Mood is not depressed. Affect is blunt.        Behavior: Behavior is slowed and withdrawn.        Thought Content: Thought content does not include homicidal or suicidal ideation. Thought content does not include homicidal or suicidal plan.     ED Course/ Medical Decision Making/ A&P    Procedures Procedures   Medications Ordered in ED Medications  0.9 %  sodium chloride  infusion (has no administration in time range)  pantoprazole  (PROTONIX ) injection 40 mg (has no  administration in time range)  lactated ringers  bolus 1,000 mL (0 mLs Intravenous Stopped 11/19/23 1459)  potassium chloride  SA (KLOR-CON  M) CR tablet 40 mEq (40 mEq Oral Given 11/19/23 1340)  magnesium  sulfate IVPB 2 g 50 mL (0 g Intravenous Stopped 11/19/23 1426)  LORazepam  (ATIVAN ) injection 2 mg (2 mg Intravenous Given 11/19/23 1614)    Medical Decision Making:   Bobby Morris is a 23 y.o. male who presents for nausea, vomiting, weight loss as per above.  Physical exam is pertinent for thin appearance with scaphoid abdomen that is nontender to palpation.   The differential includes but is not limited to cannabis hyperemesis, gastric outlet obstruction, pancreatitis, AKI, electrolyte derangement, refeeding syndrome.  Independent historian: Girlfriend at bedside  External data reviewed: Notes: Patient has had numerous visits to outside hospitals earlier this month for similar complaints of nausea and vomiting  Labs: Ordered, Independent interpretation, and Details: CBC without leukocytosis, thrombocytopenia.  Mildly elevated.  17, potentially secondary to hemoconcentration. CMP with multiple derangements, AKI to 1.8 from baseline of 1-1.2.  Symmetric elevation of BUN.  Multiple electrolyte derangements, new hyperkalemia, progression of hypokalemia, new hypochloremia.  Ethanol undetectable.  UDS positive for THC.  Magnesium  elevated at 3.5  Radiology: Ordered, Independent interpretation, Details: Personally reviewed patient CT of the abdomen pelvis, I do not appreciate intra-abdominal free fluid or free air, no obstructive bowel gas pattern, I do not appreciate a mass lesion in the upper abdomen that would contribute to gastric outlet obstruction, and All images reviewed independently.  Agree with radiology report at this time.   CT ABDOMEN PELVIS WO CONTRAST Result Date: 11/19/2023 EXAM: CT ABDOMEN AND PELVIS WITHOUT CONTRAST 11/19/2023 02:39:14 PM TECHNIQUE: CT of the abdomen and pelvis was  performed without the administration of intravenous contrast. Multiplanar reformatted images are provided for review. Automated exposure control, iterative reconstruction, and/or weight-based adjustment of the mA/kV was utilized to reduce the radiation dose to as low as reasonably achievable. COMPARISON: 06/01/2023 CLINICAL HISTORY: Abdominal pain, acute, nonlocalized; diffuse abdominal pain, persistent vomiting, 25lb weight loss. Pt presents to ED from home accompanied by girlfriend who is concerned because patient has not eaten in almost 2 weeks. Pt refusing to talk in triage or answer questions for himself. When asked questions, patient just points to his girlfriend, who answers for him FINDINGS: LOWER CHEST: Visualized lung bases are clear. LIVER: Normal size and contour. GALLBLADDER AND BILE DUCTS: No wall thickening. No cholelithiasis. No biliary ductal dilatation. SPLEEN: Normal size. No focal lesion. PANCREAS: No mass. No ductal dilatation. ADRENAL GLANDS: Normal appearance. No mass. KIDNEYS, URETERS AND BLADDER: No stones in the kidneys or ureters. No hydronephrosis. No perinephric or periureteral stranding. Urinary bladder is unremarkable. GI AND BOWEL: Stomach demonstrates no  acute abnormality. There is no bowel obstruction. No bowel wall thickening. PERITONEUM AND RETROPERITONEUM: No ascites. No free air. VASCULATURE: Aorta is normal in caliber. LYMPH NODES: No lymphadenopathy. REPRODUCTIVE ORGANS: No significant abnormality. BONES AND SOFT TISSUES: No acute osseous abnormality. No focal soft tissue abnormality. Paucity of intra-abdominal fat limits evaluation. Pelvic phleboliths. IMPRESSION: 1. No acute findings in the abdomen or pelvis. Electronically signed by: Katheleen Faes MD 11/19/2023 02:49 PM EDT RP Workstation: [id number]   DG Chest 2 View Result Date: 11/09/2023 CLINICAL DATA:  Chest and abdominal pain, vomiting EXAM: CHEST - 2 VIEW COMPARISON:  09/23/2022 FINDINGS: The heart size and  mediastinal contours are within normal limits. Both lungs are clear. The visualized skeletal structures are unremarkable. IMPRESSION: No active cardiopulmonary disease. Electronically Signed   By: Ozell Daring M.D.   On: 11/09/2023 13:09    EKG/Medicine tests: Ordered and Independent interpretation EKG Interpretation: Sinus rhythm Biatrial enlargement Borderline right axis deviation ST elev, probable normal early repol pattern Prolonged QT interval Artifact in lead(s) V5 V6 Development of QTc prolongation on comparison to most recent prior EKG from 09-Nov-2023 Confirmed by Rogelia Satterfield ([id number]) on 11/19/2023 5:35:53 PM  Interventions: Potassium, magnesium , LR bolus, Ativan    See the EMR for full details regarding lab and imaging results.  Patient well-appearing on exam, though hemodynamically stable.  Considered psychiatric component to presentation, however patient denies depression, SI, HI.  Considered gastric outlet obstruction or gastroparesis given chronic nausea and vomiting, as well as other pathophysiology such as cannabis hyperemesis syndrome, though notably patient denies THC use on my exam.  Given unclear etiology based on history and exam, I did obtain CT of abdomen pelvis, noncontrasted in the setting of patient's AKI, which does not reveal overt physiologic etiology of patient's symptoms.  Labs do reveal AKI as well as numerous electrolyte derangements.  Patient had hypokalemia repleted, magnesium  was repleted as well to facilitate potassium repletion.  Given AKI patient was administered LR bolus.  Patient unfortunately with QTc prolongation on EKG, therefore administered Ativan  for nausea and vomiting in the ED.  Notably patient's UDS was positive for THC, which is inconsistent with his reports of not utilizing this recreational substance.  Ultimately given multiple electrolyte derangements, AKI, weight loss, and thus risk for refeeding syndrome with resumption of nutrition, do feel  that patient warrants admission.  Medicine consulted and patient accepted to hospitalist service.  Presentation is most consistent with acute complicated illness and Current presentation is complicated by underlying chronic conditions  Discussion of management or test interpretations with external provider(s): Hospitalists, Dr. Sebastian  Risk Drugs:Prescription drug management and Parenteral controlled substances  Disposition: ADMIT: I believe the patient requires admission for further care and management. The patient was admitted to hospitalist. Please see inpatient provider note for additional treatment plan details.   MDM generated using voice dictation software and may contain dictation errors.  Please contact me for any clarification or with any questions.  Clinical Impression:  1. AKI (acute kidney injury) (HCC)   2. Dehydration   3. Failure to thrive in adult      Admit   Final Clinical Impression(s) / ED Diagnoses Final diagnoses:  AKI (acute kidney injury) (HCC)  Dehydration  Failure to thrive in adult    Rx / DC Orders ED Discharge Orders     None        Rogelia Satterfield RAMAN, MD 11/19/23 [phone]

## 2023-11-19 NOTE — H&P (Signed)
 History and Physical    Bobby Morris FMW:[id number] DOB: 2001-02-05 DOA: 11/19/2023  PCP: [facility]  Patient coming from: Home  I have personally briefly reviewed patient's old medical records in [facility]  Chief Complaint: Nausea/vomiting/unable to keep anything down  HPI: Bobby Morris is a 23 y.o. male with no significant medical history presenting to the ED with a 3-week history of nausea, vomiting, decreased oral intake which had worsened over the past week.  Patient states some associated shortness of breath with this, burning sensation in his chest with emesis and occasionally emesis will be what ever he drinks occasionally yellow and also noted to have some black stuff in it.  Patient does endorse some lightheadedness and dizziness.  Patient also does endorse some streaks of hematemesis with excessive vomiting.  Patient denies any chest pain, no abdominal pain, no diarrhea, no constipation, no melena, no significant hematemesis, no syncopal episode.  Patient does endorse 30 pound weight loss over the past 3 weeks.  Patient also with decreased oral intake and mainly eating popsicles.  Patient states has had prior symptoms similar to this which may have occurred every 5 or 6 months but usually improves after a week.  Patient does endorse occasional cannabis use and states does not use cannabis on a daily frequent basis.  Patient denies any depressive symptoms, denies any suicidal or homicidal ideation.  ED Course: Patient seen in the ED, vital signs noted to be stable.  EKG concerning for QT prolongation.  Comprehensive metabolic profile with a sodium of 130, potassium of 2.9, chloride of 29, glucose of 162, BUN of 48, creatinine of 1.84, calcium of 10.4, magnesium  3.5, albumin of 5.4 otherwise within normal limits.  CBC done with a hemoglobin of 17.4 otherwise within normal limits.  Alcohol level noted to be < 15.  UDS positive for THC.  CT abdomen and pelvis  done unremarkable.  Patient given a bolus of IV fluids, IV Ativan  for nausea.  Hospitalist called to admit the patient for further evaluation and management.  Review of Systems: As per HPI otherwise all other systems reviewed and are negative.  Past Medical History:  Diagnosis Date   History of asthma    as a child - no longer on medication   Right ACL tear 01/2017    Past Surgical History:  Procedure Laterality Date   KNEE ARTHROSCOPY WITH ANTERIOR CRUCIATE LIGAMENT (ACL) REPAIR Right 02/19/2017   Procedure: RIGHT ARTHROSCOPY KNEE WITH ANTERIOR CRUCIATE LIGAMENT RECONSTRUCTION WITH ALLOGRAFT.;  Surgeon: Yvone Rush, MD;  Location: [facility];  Service: Orthopedics;  Laterality: Right;    Social History  reports that he has never smoked. He has never used smokeless tobacco. He reports that he does not currently use drugs. He reports that he does not drink alcohol.  No Known Allergies  Family History  Problem Relation Age of Onset   Diabetes Maternal Grandmother    Hypertension Maternal Grandmother    Mother alive age 86 and healthy.  Father deceased age 59 patient unsure of cause of death.  Prior to Admission medications   Medication Sig Start Date End Date Taking? Authorizing Provider  dicyclomine  (BENTYL ) 20 MG tablet Take 1 tablet (20 mg total) by mouth 2 (two) times daily. 06/05/23   Prosperi, Christian H, PA-C  HYDROcodone -acetaminophen  (NORCO) 5-325 MG tablet Take 1-2 tablets by mouth every 6 (six) hours as needed for moderate pain. Patient not taking: Reported on 06/22/2018 02/19/17   Rondall,  Lyn[place], PA-C  ondansetron  (ZOFRAN -ODT) 8 MG disintegrating tablet Take 1 tablet (8 mg total) by mouth every 8 (eight) hours as needed for nausea or vomiting. 11/09/23   Randol Simmonds, MD  pantoprazole  (PROTONIX ) 20 MG tablet Take 1 tablet (20 mg total) by mouth daily. 06/05/23   Randol Simmonds, MD    Physical Exam: Vitals:   11/19/23 1400 11/19/23 1500 11/19/23 1530 11/19/23  1615  BP: (!) 128/93 134/84 132/85 125/89  Pulse: 88 80 83 87  Resp:  18 14   Temp:   98.1 F (36.7 C)   TempSrc:   Oral   SpO2: 99% 100% 97% 100%  Weight:      Height:        Constitutional: NAD, calm, comfortable Vitals:   11/19/23 1400 11/19/23 1500 11/19/23 1530 11/19/23 1615  BP: (!) 128/93 134/84 132/85 125/89  Pulse: 88 80 83 87  Resp:  18 14   Temp:   98.1 F (36.7 C)   TempSrc:   Oral   SpO2: 99% 100% 97% 100%  Weight:      Height:       Eyes: PERRL, lids and conjunctivae normal ENMT: Mucous membranes are dry. Posterior pharynx clear of any exudate or lesions.Normal dentition.  Neck: normal, supple, no masses, no thyromegaly Respiratory: clear to auscultation bilaterally, no wheezing, no crackles. Normal respiratory effort. No accessory muscle use.  Cardiovascular: Tachycardia, no murmurs / rubs / gallops. No extremity edema. 2+ pedal pulses. No carotid bruits.  Abdomen: no tenderness, no masses palpated. No hepatosplenomegaly. Bowel sounds positive.  Musculoskeletal: no clubbing / cyanosis. No joint deformity upper and lower extremities. Good ROM, no contractures. Normal muscle tone.  Skin: no rashes, lesions, ulcers. No induration Neurologic: CN 2-12 grossly intact. Sensation intact, DTR normal. Strength 5/5 in all 4.  Psychiatric: Normal judgment and insight. Alert and oriented x 3. Normal mood.   Labs on Admission: I have personally reviewed following labs and imaging studies  CBC: Recent Labs  Lab 11/19/23 1158  WBC 8.3  HGB 17.4*  HCT 49.3  MCV 81.4  PLT 392    Basic Metabolic Panel: Recent Labs  Lab 11/19/23 1158  NA 130*  K 2.9*  CL 79*  CO2 32  GLUCOSE 162*  BUN 48*  CREATININE 1.84*  CALCIUM 10.4*  MG 3.5*    GFR: Estimated Creatinine Clearance: 43.4 mL/min (A) (by C-G formula based on SCr of 1.84 mg/dL (H)).  Liver Function Tests: Recent Labs  Lab 11/19/23 1158  AST 24  ALT 17  ALKPHOS 56  BILITOT 1.2  PROT 8.5*  ALBUMIN  5.4*    Urine analysis:    Component Value Date/Time   COLORURINE AMBER (A) 11/09/2023 1235   APPEARANCEUR CLEAR 11/09/2023 1235   LABSPEC 1.033 (H) 11/09/2023 1235   PHURINE 6.0 11/09/2023 1235   GLUCOSEU NEGATIVE 11/09/2023 1235   HGBUR NEGATIVE 11/09/2023 1235   BILIRUBINUR NEGATIVE 11/09/2023 1235   KETONESUR 20 (A) 11/09/2023 1235   PROTEINUR 100 (A) 11/09/2023 1235   NITRITE NEGATIVE 11/09/2023 1235   LEUKOCYTESUR NEGATIVE 11/09/2023 1235    Radiological Exams on Admission: CT ABDOMEN PELVIS WO CONTRAST Result Date: 11/19/2023 EXAM: CT ABDOMEN AND PELVIS WITHOUT CONTRAST 11/19/2023 02:39:14 PM TECHNIQUE: CT of the abdomen and pelvis was performed without the administration of intravenous contrast. Multiplanar reformatted images are provided for review. Automated exposure control, iterative reconstruction, and/or weight-based adjustment of the mA/kV was utilized to reduce the radiation dose to as low as reasonably  achievable. COMPARISON: 06/01/2023 CLINICAL HISTORY: Abdominal pain, acute, nonlocalized; diffuse abdominal pain, persistent vomiting, 25lb weight loss. Pt presents to ED from home accompanied by girlfriend who is concerned because patient has not eaten in almost 2 weeks. Pt refusing to talk in triage or answer questions for himself. When asked questions, patient just points to his girlfriend, who answers for him FINDINGS: LOWER CHEST: Visualized lung bases are clear. LIVER: Normal size and contour. GALLBLADDER AND BILE DUCTS: No wall thickening. No cholelithiasis. No biliary ductal dilatation. SPLEEN: Normal size. No focal lesion. PANCREAS: No mass. No ductal dilatation. ADRENAL GLANDS: Normal appearance. No mass. KIDNEYS, URETERS AND BLADDER: No stones in the kidneys or ureters. No hydronephrosis. No perinephric or periureteral stranding. Urinary bladder is unremarkable. GI AND BOWEL: Stomach demonstrates no acute abnormality. There is no bowel obstruction. No bowel wall  thickening. PERITONEUM AND RETROPERITONEUM: No ascites. No free air. VASCULATURE: Aorta is normal in caliber. LYMPH NODES: No lymphadenopathy. REPRODUCTIVE ORGANS: No significant abnormality. BONES AND SOFT TISSUES: No acute osseous abnormality. No focal soft tissue abnormality. Paucity of intra-abdominal fat limits evaluation. Pelvic phleboliths. IMPRESSION: 1. No acute findings in the abdomen or pelvis. Electronically signed by: Katheleen Faes MD 11/19/2023 02:49 PM EDT RP Workstation: [id number]    EKG: Independently reviewed.  Biatrial enlargement.  QT prolongation.  Assessment/Plan Principal Problem:   Nausea & vomiting Active Problems:   AKI (acute kidney injury) (HCC)   Hypokalemia   Dehydration   QT prolongation   Hypercalcemia   Hyponatremia   Failure to thrive in adult   #1 nausea/vomiting/weight loss/FTT -Patient presenting with ongoing 3-week history of nausea vomiting with 30 pound weight loss over the past 3 weeks.  Patient was has associated lightheadedness and dizziness - Patient also with description of a burning sensation in his mid chest with emesis as well as what he describes as black stuff with the emesis. - CT abdomen and pelvis done on admission with no acute abnormalities - Due to ongoing nausea and vomiting recent weight loss will consult with GI for further evaluation.??  Upper endoscopy for further evaluation however will defer to GI. -Patient also with a history of cannabis use, however states only uses it occasionally. -UDS positive for THC. - Keep n.p.o. except ice chips and sips with meds. - Place on IV PPI every 12 hours. - IV fluids, IV antiemetics, supportive care.  2.  Dehydration -IV fluids.  3.  Hypokalemia -Secondary to GI losses. - Replete.  4.  QT prolongation -Replete electrolytes to keep potassium approximately 4, magnesium  approximately 2. - Avoid QT prolonging medications. - Repeat EKG in the AM.  5.  Acute kidney injury -Likely  secondary to prerenal azotemia in the setting of GI losses from nausea and vomiting. - Check a urinalysis, urine sodium, urine creatinine. - CT abdomen and pelvis done negative for hydronephrosis - IV fluids, supportive care - Avoid nephrotoxic agents. - Repeat labs in the a.m.  6.  Hypercalcemia -Likely secondary to dehydration. - IV fluids. - Repeat labs in the AM.  7.  Hyponatremia -Likely secondary to hypovolemic hyponatremia secondary to GI losses. - IV fluids. - Repeat labs in the AM.  DVT prophylaxis: Lovenox  Code Status:   Full Family Communication: Updated patient and girlfriend at bedside.  Updated mother Bobby Morris) on the telephone. Disposition Plan:   Patient is from:  Home  Anticipated DC to:  Home  Anticipated DC date:  2 to 3 days  Anticipated DC barriers: \Clinical improvement  Consults called:  Gastroenterology: Margarete Admission status:  Place in observation/telemetry  Severity of Illness: The appropriate patient status for this patient is OBSERVATION. Observation status is judged to be reasonable and necessary in order to provide the required intensity of service to ensure the patient's safety. The patient's presenting symptoms, physical exam findings, and initial radiographic and laboratory data in the context of their medical condition is felt to place them at decreased risk for further clinical deterioration. Furthermore, it is anticipated that the patient will be medically stable for discharge from the hospital within 2 midnights of admission.     Toribio Hummer MD [facility]  How to contact the [facility] Attending or Consulting provider 7A - 7P or covering provider during after hours 7P -7A, for this patient?   Check the care team in [facility] and look for a) attending/consulting [facility] provider listed and b) the [facility] team listed Log into [email or web address] and use [facility] universal password to access. If you do not have the password, please contact the  hospital operator. Locate the [facility] provider you are looking for under [facility] and page to a number that you can be directly reached. If you still have difficulty reaching the provider, please page the [facility] (Director on Call) for the Hospitalists listed on amion for assistance.  11/19/2023, 6:28 PM

## 2023-11-19 NOTE — ED Notes (Signed)
 Pt vomiting. Provider informed.

## 2023-11-20 ENCOUNTER — Encounter (HOSPITAL_COMMUNITY): Payer: Self-pay | Admitting: Internal Medicine

## 2023-11-20 DIAGNOSIS — R112 Nausea with vomiting, unspecified: Secondary | ICD-10-CM | POA: Diagnosis not present

## 2023-11-20 DIAGNOSIS — N179 Acute kidney failure, unspecified: Secondary | ICD-10-CM | POA: Diagnosis not present

## 2023-11-20 DIAGNOSIS — E876 Hypokalemia: Secondary | ICD-10-CM | POA: Diagnosis not present

## 2023-11-20 LAB — CBC WITH DIFFERENTIAL/PLATELET
Abs Immature Granulocytes: 0.01 K/uL (ref 0.00–0.07)
Basophils Absolute: 0.1 K/uL (ref 0.0–0.1)
Basophils Relative: 1 %
Eosinophils Absolute: 0.1 K/uL (ref 0.0–0.5)
Eosinophils Relative: 1 %
HCT: 39.5 % (ref 39.0–52.0)
Hemoglobin: 13.7 g/dL (ref 13.0–17.0)
Immature Granulocytes: 0 %
Lymphocytes Relative: 55 %
Lymphs Abs: 4.2 K/uL — ABNORMAL HIGH (ref 0.7–4.0)
MCH: 28.4 pg (ref 26.0–34.0)
MCHC: 34.7 g/dL (ref 30.0–36.0)
MCV: 81.8 fL (ref 80.0–100.0)
Monocytes Absolute: 0.6 K/uL (ref 0.1–1.0)
Monocytes Relative: 8 %
Neutro Abs: 2.6 K/uL (ref 1.7–7.7)
Neutrophils Relative %: 35 %
Platelets: 275 K/uL (ref 150–400)
RBC: 4.83 MIL/uL (ref 4.22–5.81)
RDW: 11.4 % — ABNORMAL LOW (ref 11.5–15.5)
WBC: 7.6 K/uL (ref 4.0–10.5)
nRBC: 0 % (ref 0.0–0.2)

## 2023-11-20 LAB — COMPREHENSIVE METABOLIC PANEL WITH GFR
ALT: 11 U/L (ref 0–44)
AST: 19 U/L (ref 15–41)
Albumin: 4.2 g/dL (ref 3.5–5.0)
Alkaline Phosphatase: 42 U/L (ref 38–126)
Anion gap: 14 (ref 5–15)
BUN: 32 mg/dL — ABNORMAL HIGH (ref 6–20)
CO2: 26 mmol/L (ref 22–32)
Calcium: 9.2 mg/dL (ref 8.9–10.3)
Chloride: 90 mmol/L — ABNORMAL LOW (ref 98–111)
Creatinine, Ser: 1.23 mg/dL (ref 0.61–1.24)
GFR, Estimated: >60 mL/min (ref >60)
Glucose, Bld: 106 mg/dL — ABNORMAL HIGH (ref 70–99)
Potassium: 3.6 mmol/L (ref 3.5–5.1)
Sodium: 129 mmol/L — ABNORMAL LOW (ref 135–145)
Total Bilirubin: 1.2 mg/dL (ref 0.0–1.2)
Total Protein: 6.3 g/dL — ABNORMAL LOW (ref 6.5–8.1)

## 2023-11-20 LAB — CREATININE, URINE, RANDOM: Creatinine, Urine: 217 mg/dL

## 2023-11-20 LAB — SODIUM, URINE, RANDOM: Sodium, Ur: <30 mmol/L

## 2023-11-20 LAB — MAGNESIUM: Magnesium: 2.8 mg/dL — ABNORMAL HIGH (ref 1.7–2.4)

## 2023-11-20 MED ORDER — POTASSIUM CHLORIDE CRYS ER 10 MEQ PO TBCR
40.0000 meq | EXTENDED_RELEASE_TABLET | Freq: Once | ORAL | Status: AC
  Administered 2023-11-20: 40 meq via ORAL
  Filled 2023-11-20: qty 4

## 2023-11-20 MED ORDER — ORAL CARE MOUTH RINSE: 15.0000 mL | OROMUCOSAL | Status: DC | PRN

## 2023-11-20 NOTE — Anesthesia Preprocedure Evaluation (Signed)
 Anesthesia Evaluation    Reviewed: Allergy & Precautions, Patient's Chart, lab work & pertinent test results  History of Anesthesia Complications Negative for: history of anesthetic complications  Airway        Dental   Pulmonary neg pulmonary ROS          Cardiovascular negative cardio ROS      Neuro/Psych negative neurological ROS  negative psych ROS   GI/Hepatic negative GI ROS, Neg liver ROS,,,  Endo/Other   Na 129 Cl 90   Renal/GU negative Renal ROS     Musculoskeletal negative musculoskeletal ROS (+)    Abdominal   Peds  Hematology negative hematology ROS (+)   Anesthesia Other Findings Failure to thrive BMI 16   Reproductive/Obstetrics                              Anesthesia Physical Anesthesia Plan  ASA: 2  Anesthesia Plan: MAC   Post-op Pain Management: Minimal or no pain anticipated   Induction:   PONV Risk Score and Plan: 1 and Propofol  infusion and Treatment may vary due to age or medical condition  Airway Management Planned: Nasal Cannula and Natural Airway  Additional Equipment: None  Intra-op Plan:   Post-operative Plan:   Informed Consent:   Plan Discussed with: CRNA and Anesthesiologist  Anesthesia Plan Comments:         Anesthesia Quick Evaluation

## 2023-11-20 NOTE — Discharge Summary (Signed)
 Physician Discharge Summary  Bobby Morris FMW:[id number] DOB: [birth year] DOA: 11/19/2023  PCP: [facility]    Patient left AMA  Admit date: 11/19/2023 Discharge date: 11/20/2023  Time spent: 50 minutes  Recommendations for Outpatient Follow-up:  Left AMA   Discharge Diagnoses:  Principal Problem:   Nausea & vomiting Active Problems:   AKI (acute kidney injury) (HCC)   Hypokalemia   Dehydration   QT prolongation   Hypercalcemia   Hyponatremia   Failure to thrive in adult   Discharge Condition: Left AMA  Diet recommendation: Left AMA  Filed Weights   11/19/23 1147  Weight: 47.9 kg    History of present illness:   Bobby Morris is a 23 y.o. male with no significant medical history presenting to the ED with a 3-week history of nausea, vomiting, decreased oral intake which had worsened over the past week.  Patient states some associated shortness of breath with this, burning sensation in his chest with emesis and occasionally emesis will be what ever he drinks occasionally yellow and also noted to have some black stuff in it.  Patient does endorse some lightheadedness and dizziness.  Patient also does endorse some streaks of hematemesis with excessive vomiting.  Patient denies any chest pain, no abdominal pain, no diarrhea, no constipation, no melena, no significant hematemesis, no syncopal episode.  Patient does endorse 30 pound weight loss over the past 3 weeks.  Patient also with decreased oral intake and mainly eating popsicles.  Patient states has had prior symptoms similar to this which may have occurred every 5 or 6 months but usually improves after a week.  Patient does endorse occasional cannabis use and states does not use cannabis on a daily frequent basis.  Patient denies any depressive symptoms, denies any suicidal or homicidal ideation.   ED Course: Patient seen in the ED, vital signs noted to be stable.  EKG concerning for QT prolongation.   Comprehensive metabolic profile with a sodium of 130, potassium of 2.9, chloride of 29, glucose of 162, BUN of 48, creatinine of 1.84, calcium of 10.4, magnesium  3.5, albumin of 5.4 otherwise within normal limits.  CBC done with a hemoglobin of 17.4 otherwise within normal limits.  Alcohol level noted to be < 15.  UDS positive for THC.  CT abdomen and pelvis done unremarkable.  Patient given a bolus of IV fluids, IV Ativan  for nausea.  Hospitalist called to admit the patient for further evaluation and management.  Hospital Course:  #1 nausea/vomiting/weight loss/FTT -Patient presented with ongoing 3-week history of nausea vomiting with 30 pound weight loss over the past 3 weeks.  Patient also noted on admission with complaints of lightheadedness and dizziness. - Patient also with description of a burning sensation in his mid chest with emesis as well as what he describes as black stuff with the emesis. - CT abdomen and pelvis done on admission with no acute abnormalities - Due to ongoing nausea and vomiting recent weight loss GI was consulted for further evaluation and management. - Patient assessed by GI who had recommended upper endoscopy however patient told GI that he would prefer to wait until 11/21/2023 for upper endoscopy to be done and patient subsequently placed on clear liquids.  - TFTs were ordered and pending.  -Patient placed on IV PPI. -Patient also with a history of cannabis use, however states only uses it occasionally. -UDS positive for THC. -Patient initially was made n.p.o. and subsequently placed on clear liquids after assessment by  gastroenterology on 11/20/2023. -Patient hydrated IV fluids with clinical improvement and had no further nausea or emesis. -Patient noted to have left AMA on 11/20/2023 in the midst of GI workup for his presentation. -Hopefully patient will follow-up with PCP in the outpatient setting.   2.  Dehydration - Patient hydrated with IV fluids.    3.   Hypokalemia -Secondary to GI losses. - Repleted during the hospitalization.   4.  QT prolongation - Noted on admitting EKG.   - Patient noted to be hypokalemic on admission and hypokalemia repleted.   - Patient was placed on telemetry however patient refused wearing telemetry during the hospitalization. - Patient subsequently left AMA.    5.  Acute kidney injury -Likely secondary to prerenal azotemia in the setting of GI losses from nausea and vomiting. - Urinalysis done with nitrite negative, leukocytes negative, 100 protein, 0-5 WBCs, noted to be turbid in appearance.   - Urine sodium down was < 30 - Urine creatinine 217.   - CT abdomen and pelvis done negative for hydronephrosis. -Patient hydrated with IV fluids with improvement in renal function/advised to have discharge creatinine was down to 1.23 from 1.84 on admission. -Patient left AMA.   6.  Hypercalcemia -Likely secondary to dehydration. - Patient hydrated with IV fluids with improvement with hypercalcemia resolution of hypercalcemia by day of discharge.   - Patient left AMA.    7.  Hyponatremia -Likely secondary to hypovolemic hyponatremia secondary to GI losses. -Patient hydrated with IV fluids. -Patient while in the process of being by GI for problem #1 subsequently left the hospital AMA. -Hopefully patient follows up with PCP in the outpatient setting.  Procedures: CT abdomen and pelvis 11/19/2023  Consultations: Gastroenterology: Dr. Rosalie 11/20/2023  Discharge Exam: Vitals:   11/20/23 0502 11/20/23 1320  BP: 128/86 138/81  Pulse: 86 92  Resp: 15 16  Temp: 98 F (36.7 C) 97.9 F (36.6 C)  SpO2: 100% 100%    General: NAD Cardiovascular: Regular rate rhythm no murmurs rubs or gallops.  No JVD.  No lower extremity edema. Respiratory: Clear to auscultation bilaterally.  No wheezes, no crackles, no rhonchi.  Fair air movement.  Speaking in full sentences. GI: Abdomen is soft, nontender, nondistended,  positive bowel sounds.  No rebound.  No guarding.  Discharge Instructions  Patient left AMA   No Known Allergies    The results of significant diagnostics from this hospitalization (including imaging, microbiology, ancillary and laboratory) are listed below for reference.    Significant Diagnostic Studies: CT ABDOMEN PELVIS WO CONTRAST Result Date: 11/19/2023 EXAM: CT ABDOMEN AND PELVIS WITHOUT CONTRAST 11/19/2023 02:39:14 PM TECHNIQUE: CT of the abdomen and pelvis was performed without the administration of intravenous contrast. Multiplanar reformatted images are provided for review. Automated exposure control, iterative reconstruction, and/or weight-based adjustment of the mA/kV was utilized to reduce the radiation dose to as low as reasonably achievable. COMPARISON: 06/01/2023 CLINICAL HISTORY: Abdominal pain, acute, nonlocalized; diffuse abdominal pain, persistent vomiting, 25lb weight loss. Pt presents to ED from home accompanied by girlfriend who is concerned because patient has not eaten in almost 2 weeks. Pt refusing to talk in triage or answer questions for himself. When asked questions, patient just points to his girlfriend, who answers for him FINDINGS: LOWER CHEST: Visualized lung bases are clear. LIVER: Normal size and contour. GALLBLADDER AND BILE DUCTS: No wall thickening. No cholelithiasis. No biliary ductal dilatation. SPLEEN: Normal size. No focal lesion. PANCREAS: No mass. No ductal dilatation. ADRENAL GLANDS: Normal appearance.  No mass. KIDNEYS, URETERS AND BLADDER: No stones in the kidneys or ureters. No hydronephrosis. No perinephric or periureteral stranding. Urinary bladder is unremarkable. GI AND BOWEL: Stomach demonstrates no acute abnormality. There is no bowel obstruction. No bowel wall thickening. PERITONEUM AND RETROPERITONEUM: No ascites. No free air. VASCULATURE: Aorta is normal in caliber. LYMPH NODES: No lymphadenopathy. REPRODUCTIVE ORGANS: No significant  abnormality. BONES AND SOFT TISSUES: No acute osseous abnormality. No focal soft tissue abnormality. Paucity of intra-abdominal fat limits evaluation. Pelvic phleboliths. IMPRESSION: 1. No acute findings in the abdomen or pelvis. Electronically signed by: Katheleen Faes MD 11/19/2023 02:49 PM EDT RP Workstation: [id number]   DG Chest 2 View Result Date: 11/09/2023 CLINICAL DATA:  Chest and abdominal pain, vomiting EXAM: CHEST - 2 VIEW COMPARISON:  09/23/2022 FINDINGS: The heart size and mediastinal contours are within normal limits. Both lungs are clear. The visualized skeletal structures are unremarkable. IMPRESSION: No active cardiopulmonary disease. Electronically Signed   By: Ozell Daring M.D.   On: 11/09/2023 13:09    Microbiology: No results found for this or any previous visit (from the past 240 hours).   Labs: Basic Metabolic Panel: Recent Labs  Lab 11/19/23 1158 11/20/23 0534  NA 130* 129*  K 2.9* 3.6  CL 79* 90*  CO2 32 26  GLUCOSE 162* 106*  BUN 48* 32*  CREATININE 1.84* 1.23  CALCIUM 10.4* 9.2  MG 3.5* 2.8*   Liver Function Tests: Recent Labs  Lab 11/19/23 1158 11/20/23 0534  AST 24 19  ALT 17 11  ALKPHOS 56 42  BILITOT 1.2 1.2  PROT 8.5* 6.3*  ALBUMIN 5.4* 4.2   No results for input(s): LIPASE, AMYLASE in the last 168 hours. No results for input(s): AMMONIA in the last 168 hours. CBC: Recent Labs  Lab 11/19/23 1158 11/20/23 0534  WBC 8.3 7.6  NEUTROABS  --  2.6  HGB 17.4* 13.7  HCT 49.3 39.5  MCV 81.4 81.8  PLT 392 275   Cardiac Enzymes: No results for input(s): CKTOTAL, CKMB, CKMBINDEX, TROPONINI in the last 168 hours. BNP: BNP (last 3 results) No results for input(s): BNP in the last 8760 hours.  ProBNP (last 3 results) No results for input(s): PROBNP in the last 8760 hours.  CBG: No results for input(s): GLUCAP in the last 168 hours.     Signed:  Toribio Hummer MD.  [facility] 11/20/2023, 4:32 PM

## 2023-11-20 NOTE — Plan of Care (Signed)

## 2023-11-20 NOTE — Plan of Care (Signed)
  Problem: Clinical Measurements: Goal: Ability to maintain clinical measurements within normal limits will improve Outcome: Progressing Goal: Diagnostic test results will improve Outcome: Progressing   

## 2023-11-20 NOTE — Progress Notes (Signed)
   11/20/23 1441  TOC Brief Assessment  Insurance and Status Reviewed  Patient has primary care physician Yes  Home environment has been reviewed home w/ parents  Prior level of function: independent  Prior/Current Home Services No current home services  Social Drivers of Health Review SDOH reviewed no interventions necessary  Readmission risk has been reviewed Yes  Transition of care needs no transition of care needs at this time

## 2023-11-20 NOTE — Consult Note (Signed)
 Reason for Consult: Nausea vomiting weight loss coffee-ground emesis Referring Physician: Hospital team  Bobby Morris is an 23 y.o. male.  HPI: Patient seen and examined in his hospital computer chart reviewed and his case discussed with both his girlfriend and his stepmother who he lives with and he has had episodic nausea vomiting for a while but no abdominal pain and Zofran  helps the nausea and he denies drug use despite a positive THC and he denies aspirin and nonsteroidals and has lost up to 30 pounds and he does have a job and his family history is negative for any GI issues and although his girlfriend thinks there are more issues he denies any depression and sadness frustration etc. and has no other complaints  Past Medical History:  Diagnosis Date   History of asthma    as a child - no longer on medication   Right ACL tear 01/2017    Past Surgical History:  Procedure Laterality Date   KNEE ARTHROSCOPY WITH ANTERIOR CRUCIATE LIGAMENT (ACL) REPAIR Right 02/19/2017   Procedure: RIGHT ARTHROSCOPY KNEE WITH ANTERIOR CRUCIATE LIGAMENT RECONSTRUCTION WITH ALLOGRAFT.;  Surgeon: Yvone Rush, MD;  Location: [facility];  Service: Orthopedics;  Laterality: Right;    Family History  Problem Relation Age of Onset   Diabetes Maternal Grandmother    Hypertension Maternal Grandmother     Social History:  reports that he has never smoked. He has never used smokeless tobacco. He reports that he does not currently use drugs. He reports that he does not drink alcohol.  Allergies: No Known Allergies  Medications: I have reviewed the patient's current medications.  Results for orders placed or performed during the hospital encounter of 11/19/23 (from the past 48 hours)  Comprehensive metabolic panel     Status: Abnormal   Collection Time: 11/19/23 11:58 AM  Result Value Ref Range   Sodium 130 (L) 135 - 145 mmol/L   Potassium 2.9 (L) 3.5 - 5.1 mmol/L   Chloride 79 (L) 98 - 111  mmol/L   CO2 32 22 - 32 mmol/L   Glucose, Bld 162 (H) 70 - 99 mg/dL    Comment: Glucose reference range applies only to samples taken after fasting for at least 8 hours.   BUN 48 (H) 6 - 20 mg/dL   Creatinine, Ser 8.15 (H) 0.61 - 1.24 mg/dL   Calcium 89.5 (H) 8.9 - 10.3 mg/dL   Total Protein 8.5 (H) 6.5 - 8.1 g/dL   Albumin 5.4 (H) 3.5 - 5.0 g/dL   AST 24 15 - 41 U/L   ALT 17 0 - 44 U/L   Alkaline Phosphatase 56 38 - 126 U/L   Total Bilirubin 1.2 0.0 - 1.2 mg/dL   GFR, Estimated 53 (L) >60 mL/min    Comment: (NOTE) Calculated using the CKD-EPI Creatinine Equation (2021)    Anion gap 19 (H) 5 - 15    Comment: Performed at [facility], [address] [place], [place] [address]  Ethanol     Status: None   Collection Time: 11/19/23 11:58 AM  Result Value Ref Range   Alcohol, Ethyl (B) <15 <15 mg/dL    Comment: (NOTE) For medical purposes only. Performed at [facility], [address] [place], [place] [address]   cbc     Status: Abnormal   Collection Time: 11/19/23 11:58 AM  Result Value Ref Range   WBC 8.3 4.0 - 10.5 K/uL   RBC 6.06 (H) 4.22 - 5.81  MIL/uL   Hemoglobin 17.4 (H) 13.0 - 17.0 g/dL   HCT 50.6 60.9 - 47.9 %   MCV 81.4 80.0 - 100.0 fL   MCH 28.7 26.0 - 34.0 pg   MCHC 35.3 30.0 - 36.0 g/dL   RDW 88.2 88.4 - 84.4 %   Platelets 392 150 - 400 K/uL   nRBC 0.0 0.0 - 0.2 %    Comment: Performed at [facility], [address] [place], [place] [address]  Magnesium      Status: Abnormal   Collection Time: 11/19/23 11:58 AM  Result Value Ref Range   Magnesium  3.5 (H) 1.7 - 2.4 mg/dL    Comment: Performed at [facility], [address], [place], [place] [address]  Rapid urine drug screen (hospital performed)     Status: Abnormal   Collection Time: 11/19/23  1:44 PM  Result Value Ref Range   Opiates NEGATIVE NEGATIVE   Cocaine NEGATIVE NEGATIVE   Benzodiazepines NEGATIVE NEGATIVE    Amphetamines NEGATIVE NEGATIVE   Tetrahydrocannabinol POSITIVE (A) NEGATIVE   Barbiturates NEGATIVE NEGATIVE   Methadone Scn, Ur NEGATIVE NEGATIVE   Fentanyl  NEGATIVE NEGATIVE    Comment: (NOTE) Drug screen is for Medical Purposes only. Positive results are preliminary only. If confirmation is needed, notify lab within 5 days.  Drug Class                 Cutoff (ng/mL) Amphetamine and metabolites 1000 Barbiturate and metabolites 200 Benzodiazepine              200 Opiates and metabolites     300 Cocaine and metabolites     300 THC                         50 Fentanyl                     5 Methadone                   300  Trazodone is metabolized in vivo to several metabolites,  including pharmacologically active m-CPP, which is excreted in the  urine.  Immunoassay screens for amphetamines and MDMA have potential  cross-reactivity with these compounds and may provide false positive  result.  Performed at [facility], [address], [place], [place] [address]   Urinalysis, Complete w Microscopic -Urine, Clean Catch     Status: Abnormal   Collection Time: 11/19/23  1:44 PM  Result Value Ref Range   Color, Urine YELLOW YELLOW   APPearance TURBID (A) CLEAR   Specific Gravity, Urine 1.026 1.005 - 1.030   pH 5.0 5.0 - 8.0   Glucose, UA NEGATIVE NEGATIVE mg/dL   Hgb urine dipstick NEGATIVE NEGATIVE   Bilirubin Urine NEGATIVE NEGATIVE   Ketones, ur NEGATIVE NEGATIVE mg/dL   Protein, ur 899 (A) NEGATIVE mg/dL   Nitrite NEGATIVE NEGATIVE   Leukocytes,Ua NEGATIVE NEGATIVE   RBC / HPF 0-5 0 - 5 RBC/hpf   WBC, UA 0-5 0 - 5 WBC/hpf   Bacteria, UA NONE SEEN NONE SEEN   Squamous Epithelial / HPF 0-5 0 - 5 /HPF   Mucus PRESENT    Hyaline Casts, UA PRESENT    Amorphous Crystal PRESENT     Comment: Performed at [facility], [address], [place], [place] [address]  Sodium, urine, random     Status: None   Collection Time: 11/19/23  1:44 PM  Result Value Ref Range   Sodium, Ur <30 mmol/L    Comment: Performed at [facility], [address] [place], [place] [address]  Creatinine, urine, random     Status: None   Collection Time: 11/19/23  1:44 PM  Result Value Ref Range   Creatinine, Urine 217 mg/dL    Comment: Performed at [facility], [address], [place], [place] [address]  HIV Antibody (routine testing w rflx)     Status: None   Collection Time: 11/19/23  7:47 PM  Result Value Ref Range   HIV Screen 4th Generation wRfx Non Reactive Non Reactive    Comment: Performed at [facility], [address], [place], [place] [address]  Magnesium      Status: Abnormal   Collection Time: 11/20/23  5:34 AM  Result Value Ref Range   Magnesium  2.8 (H) 1.7 - 2.4 mg/dL    Comment: Performed at [facility], [address], [place], [place] [address]  CBC with Differential/Platelet     Status: Abnormal   Collection Time: 11/20/23  5:34 AM  Result Value Ref Range   WBC 7.6 4.0 - 10.5 K/uL   RBC 4.83 4.22 - 5.81 MIL/uL   Hemoglobin 13.7 13.0 - 17.0 g/dL   HCT 60.4 60.9 - 47.9 %   MCV 81.8 80.0 - 100.0 fL   MCH 28.4 26.0 - 34.0 pg   MCHC 34.7 30.0 - 36.0 g/dL   RDW 88.5 (L) 88.4 - 84.4 %   Platelets 275 150 - 400 K/uL   nRBC 0.0 0.0 - 0.2 %   Neutrophils Relative % 35 %   Neutro Abs 2.6 1.7 - 7.7 K/uL   Lymphocytes Relative 55 %   Lymphs Abs 4.2 (H) 0.7 - 4.0 K/uL   Monocytes Relative 8 %   Monocytes Absolute 0.6 0.1 - 1.0 K/uL   Eosinophils Relative 1 %   Eosinophils Absolute 0.1 0.0 - 0.5 K/uL   Basophils Relative 1 %   Basophils Absolute 0.1 0.0 - 0.1 K/uL   Immature Granulocytes 0 %   Abs Immature Granulocytes 0.01 0.00 - 0.07 K/uL    Comment: Performed at [facility], [address], [place], [place] [address]  Comprehensive metabolic panel     Status: Abnormal   Collection Time: 11/20/23  5:34 AM  Result Value Ref Range   Sodium  129 (L) 135 - 145 mmol/L   Potassium 3.6 3.5 - 5.1 mmol/L    Comment: Delta check noted    Chloride 90 (L) 98 - 111 mmol/L   CO2 26 22 - 32 mmol/L   Glucose, Bld 106 (H) 70 - 99 mg/dL    Comment: Glucose reference range applies only to samples taken after fasting for at least 8 hours.   BUN 32 (H) 6 - 20 mg/dL   Creatinine, Ser 8.76 0.61 - 1.24 mg/dL    Comment: Delta check noted    Calcium 9.2 8.9 - 10.3 mg/dL   Total Protein 6.3 (L) 6.5 - 8.1 g/dL   Albumin 4.2 3.5 - 5.0 g/dL   AST 19 15 - 41 U/L   ALT 11 0 - 44 U/L   Alkaline Phosphatase 42 38 - 126 U/L   Total Bilirubin 1.2 0.0 - 1.2 mg/dL   GFR, Estimated >39 >39 mL/min    Comment: (NOTE) Calculated using the CKD-EPI Creatinine Equation (2021)    Anion gap 14 5 - 15  Comment: Performed at [facility], [address], [place], [place] [address]    CT ABDOMEN PELVIS WO CONTRAST Result Date: 11/19/2023 EXAM: CT ABDOMEN AND PELVIS WITHOUT CONTRAST 11/19/2023 02:39:14 PM TECHNIQUE: CT of the abdomen and pelvis was performed without the administration of intravenous contrast. Multiplanar reformatted images are provided for review. Automated exposure control, iterative reconstruction, and/or weight-based adjustment of the mA/kV was utilized to reduce the radiation dose to as low as reasonably achievable. COMPARISON: 06/01/2023 CLINICAL HISTORY: Abdominal pain, acute, nonlocalized; diffuse abdominal pain, persistent vomiting, 25lb weight loss. Pt presents to ED from home accompanied by girlfriend who is concerned because patient has not eaten in almost 2 weeks. Pt refusing to talk in triage or answer questions for himself. When asked questions, patient just points to his girlfriend, who answers for him FINDINGS: LOWER CHEST: Visualized lung bases are clear. LIVER: Normal size and contour. GALLBLADDER AND BILE DUCTS: No wall thickening. No cholelithiasis. No biliary ductal dilatation. SPLEEN: Normal size. No focal  lesion. PANCREAS: No mass. No ductal dilatation. ADRENAL GLANDS: Normal appearance. No mass. KIDNEYS, URETERS AND BLADDER: No stones in the kidneys or ureters. No hydronephrosis. No perinephric or periureteral stranding. Urinary bladder is unremarkable. GI AND BOWEL: Stomach demonstrates no acute abnormality. There is no bowel obstruction. No bowel wall thickening. PERITONEUM AND RETROPERITONEUM: No ascites. No free air. VASCULATURE: Aorta is normal in caliber. LYMPH NODES: No lymphadenopathy. REPRODUCTIVE ORGANS: No significant abnormality. BONES AND SOFT TISSUES: No acute osseous abnormality. No focal soft tissue abnormality. Paucity of intra-abdominal fat limits evaluation. Pelvic phleboliths. IMPRESSION: 1. No acute findings in the abdomen or pelvis. Electronically signed by: Katheleen Faes MD 11/19/2023 02:49 PM EDT RP Workstation: [id number]    ROS negative except above Blood pressure 128/86, pulse 86, temperature 98 F (36.7 C), resp. rate 15, height 5' 8 (1.727 m), weight 47.9 kg, SpO2 100%. Physical Exam vital signs stable afebrile no acute distress abdomen is soft nontender CT normal labs normal except for increased BUN and creatinine from probable dehydration he did have a slightly elevated free T4 with normal TSH earlier in the year  Assessment/Plan: Nausea vomiting weight loss self-limited hematemesis Plan: I offered him an endoscopy later today but I could not guarantee what time based on the endoscopy and anesthesia/schedule but he prefers just to wait till tomorrow and will allow clear liquids today and we will repeat thyroid  studies in the meantime and the risk benefits methods of endoscopy was discussed with the patient and my partner Dr. Saintclair will proceed tomorrow which I told him  [facility] 11/20/2023, 10:33 AM

## 2023-11-20 NOTE — Progress Notes (Signed)
 VAT: Consult for PIV insertion  Pt refused PIV insertion. Care RN aware.  Consult complete.

## 2023-11-21 ENCOUNTER — Encounter (HOSPITAL_COMMUNITY): Payer: Self-pay | Admitting: Anesthesiology

## 2023-11-21 SURGERY — EGD (ESOPHAGOGASTRODUODENOSCOPY)
Anesthesia: Monitor Anesthesia Care

## 2024-04-07 ENCOUNTER — Other Ambulatory Visit: Payer: Self-pay

## 2024-04-07 ENCOUNTER — Emergency Department (HOSPITAL_COMMUNITY)
Admission: EM | Admit: 2024-04-07 | Discharge: 2024-04-07 | Disposition: A | Discharge status: Home / Self Care | Payer: MEDICAID

## 2024-04-07 ENCOUNTER — Encounter (HOSPITAL_COMMUNITY): Payer: Self-pay

## 2024-04-07 DIAGNOSIS — N179 Acute kidney failure, unspecified: Secondary | ICD-10-CM | POA: Diagnosis not present

## 2024-04-07 DIAGNOSIS — F1299 Cannabis use, unspecified with unspecified cannabis-induced disorder: Secondary | ICD-10-CM | POA: Diagnosis not present

## 2024-04-07 DIAGNOSIS — E876 Hypokalemia: Secondary | ICD-10-CM | POA: Diagnosis not present

## 2024-04-07 DIAGNOSIS — E860 Dehydration: Secondary | ICD-10-CM | POA: Diagnosis not present

## 2024-04-07 DIAGNOSIS — R1116 Cannabis hyperemesis syndrome: Secondary | ICD-10-CM | POA: Insufficient documentation

## 2024-04-07 DIAGNOSIS — R109 Unspecified abdominal pain: Secondary | ICD-10-CM | POA: Diagnosis not present

## 2024-04-07 DIAGNOSIS — R112 Nausea with vomiting, unspecified: Secondary | ICD-10-CM | POA: Diagnosis present

## 2024-04-07 LAB — COMPREHENSIVE METABOLIC PANEL WITH GFR
ALT: 9 U/L (ref 0–44)
AST: 23 U/L (ref 15–41)
Albumin: 5.8 g/dL — ABNORMAL HIGH (ref 3.5–5.0)
Alkaline Phosphatase: 66 U/L (ref 38–126)
Anion gap: 17 — ABNORMAL HIGH (ref 5–15)
BUN: 67 mg/dL — ABNORMAL HIGH (ref 6–20)
CO2: 40 mmol/L — ABNORMAL HIGH (ref 22–32)
Calcium: 11 mg/dL — ABNORMAL HIGH (ref 8.9–10.3)
Chloride: 84 mmol/L — ABNORMAL LOW (ref 98–111)
Creatinine, Ser: 2.27 mg/dL — ABNORMAL HIGH (ref 0.61–1.24)
GFR, Estimated: 41 mL/min — ABNORMAL LOW
Glucose, Bld: 145 mg/dL — ABNORMAL HIGH (ref 70–99)
Potassium: 3 mmol/L — ABNORMAL LOW (ref 3.5–5.1)
Sodium: 141 mmol/L (ref 135–145)
Total Bilirubin: 1.1 mg/dL (ref 0.0–1.2)
Total Protein: 9.6 g/dL — ABNORMAL HIGH (ref 6.5–8.1)

## 2024-04-07 LAB — CBC WITH DIFFERENTIAL/PLATELET
Abs Immature Granulocytes: 0.02 K/uL (ref 0.00–0.07)
Basophils Absolute: 0 K/uL (ref 0.0–0.1)
Basophils Relative: 0 %
Eosinophils Absolute: 0 K/uL (ref 0.0–0.5)
Eosinophils Relative: 0 %
HCT: 52.1 % — ABNORMAL HIGH (ref 39.0–52.0)
Hemoglobin: 17.7 g/dL — ABNORMAL HIGH (ref 13.0–17.0)
Immature Granulocytes: 0 %
Lymphocytes Relative: 34 %
Lymphs Abs: 2.9 K/uL (ref 0.7–4.0)
MCH: 28.4 pg (ref 26.0–34.0)
MCHC: 34 g/dL (ref 30.0–36.0)
MCV: 83.5 fL (ref 80.0–100.0)
Monocytes Absolute: 0.7 K/uL (ref 0.1–1.0)
Monocytes Relative: 8 %
Neutro Abs: 4.9 K/uL (ref 1.7–7.7)
Neutrophils Relative %: 58 %
Platelets: 388 K/uL (ref 150–400)
RBC: 6.24 MIL/uL — ABNORMAL HIGH (ref 4.22–5.81)
RDW: 12.1 % (ref 11.5–15.5)
WBC: 8.5 K/uL (ref 4.0–10.5)
nRBC: 0 % (ref 0.0–0.2)

## 2024-04-07 LAB — LIPASE, BLOOD: Lipase: 34 U/L (ref 11–51)

## 2024-04-07 MED ORDER — LACTATED RINGERS IV BOLUS
1000.0000 mL | Freq: Once | INTRAVENOUS | Status: AC
  Administered 2024-04-07: 1000 mL via INTRAVENOUS

## 2024-04-07 MED ORDER — POTASSIUM CHLORIDE 20 MEQ PO PACK
20.0000 meq | PACK | Freq: Once | ORAL | Status: AC
  Administered 2024-04-07: 20 meq via ORAL
  Filled 2024-04-07: qty 1

## 2024-04-07 MED ORDER — KETOROLAC TROMETHAMINE 15 MG/ML IJ SOLN
15.0000 mg | Freq: Once | INTRAMUSCULAR | Status: AC
  Administered 2024-04-07: 15 mg via INTRAVENOUS
  Filled 2024-04-07: qty 1

## 2024-04-07 MED ORDER — DROPERIDOL 2.5 MG/ML IJ SOLN
1.2500 mg | Freq: Once | INTRAMUSCULAR | Status: AC
  Administered 2024-04-07: 1.25 mg via INTRAVENOUS
  Filled 2024-04-07: qty 2

## 2024-04-07 MED ORDER — ONDANSETRON 4 MG PO TBDP
4.0000 mg | ORAL_TABLET | Freq: Once | ORAL | Status: AC
  Administered 2024-04-07: 4 mg via ORAL
  Filled 2024-04-07: qty 1

## 2024-04-07 NOTE — ED Notes (Signed)
 RN went to reassess pt, pt not visualized in bed. Waited 20 minutes for patient return with no success. Pt does still have IV in place at this time.

## 2024-04-07 NOTE — ED Provider Notes (Signed)
 " [place] EMERGENCY DEPARTMENT AT [facility] Provider Note   CSN: [id number] Arrival date & time: 04/07/24  9694     Patient presents with: Emesis   Bobby Morris is a 24 y.o. male.   24 year old male presents for evaluation of nausea and vomiting.  He states has been going for 1 week.  States he has been unable to keep anything down and is having abdominal cramping.  Sister at bedside helps provide history as well.  She states that he has lost a lot of weight.  She also states he has been very weak and fatigued.  She states he does have a history of marijuana use and last had a edible on Tuesday of last week.  She states that he uses it quite regularly.  States he has had this issue before.  Patient denies any other symptoms or concerns.   Emesis Associated symptoms: abdominal pain   Associated symptoms: no arthralgias, no chills, no cough, no fever and no sore throat        Prior to Admission medications  Medication Sig Start Date End Date Taking? Authorizing Provider  dicyclomine  (BENTYL ) 20 MG tablet Take 1 tablet (20 mg total) by mouth 2 (two) times daily. Patient not taking: Reported on 11/20/2023 06/05/23   Prosperi, Christian H, PA-C  ondansetron  (ZOFRAN -ODT) 8 MG disintegrating tablet Take 1 tablet (8 mg total) by mouth every 8 (eight) hours as needed for nausea or vomiting. 11/09/23   Randol Simmonds, MD  pantoprazole  (PROTONIX ) 20 MG tablet Take 1 tablet (20 mg total) by mouth daily. Patient not taking: Reported on 11/20/2023 06/05/23   Randol Simmonds, MD    Allergies: Patient has no known allergies.    Review of Systems  Constitutional:  Negative for chills and fever.  HENT:  Negative for ear pain and sore throat.   Eyes:  Negative for pain and visual disturbance.  Respiratory:  Negative for cough and shortness of breath.   Cardiovascular:  Negative for chest pain and palpitations.  Gastrointestinal:  Positive for abdominal pain, nausea and vomiting.   Genitourinary:  Negative for dysuria and hematuria.  Musculoskeletal:  Negative for arthralgias and back pain.  Skin:  Negative for color change and rash.  Neurological:  Negative for seizures and syncope.  All other systems reviewed and are negative.   Updated Vital Signs BP (!) 112/93 (BP Location: Left Arm)   Pulse 99   Temp 97.6 F (36.4 C) (Oral)   Resp 16   SpO2 100%   Physical Exam Vitals and nursing note reviewed.  Constitutional:      General: He is not in acute distress.    Appearance: Normal appearance. He is well-developed. He is ill-appearing.  HENT:     Head: Normocephalic and atraumatic.     Mouth/Throat:     Mouth: Mucous membranes are dry.  Eyes:     Conjunctiva/sclera: Conjunctivae normal.  Cardiovascular:     Rate and Rhythm: Regular rhythm. Tachycardia present.     Heart sounds: No murmur heard. Pulmonary:     Effort: Pulmonary effort is normal. No respiratory distress.     Breath sounds: Normal breath sounds.  Abdominal:     Palpations: Abdomen is soft.     Tenderness: There is abdominal tenderness.  Musculoskeletal:        General: No swelling.     Cervical back: Neck supple.  Skin:    General: Skin is warm and dry.     Capillary Refill:  Capillary refill takes less than 2 seconds.  Neurological:     Mental Status: He is alert.  Psychiatric:        Mood and Affect: Mood normal.     (all labs ordered are listed, but only abnormal results are displayed) Labs Reviewed  CBC WITH DIFFERENTIAL/PLATELET - Abnormal; Notable for the following components:      Result Value   RBC 6.24 (*)    Hemoglobin 17.7 (*)    HCT 52.1 (*)    All other components within normal limits  COMPREHENSIVE METABOLIC PANEL WITH GFR - Abnormal; Notable for the following components:   Potassium 3.0 (*)    Chloride 84 (*)    CO2 40 (*)    Glucose, Bld 145 (*)    BUN 67 (*)    Creatinine, Ser 2.27 (*)    Calcium 11.0 (*)    Total Protein 9.6 (*)    Albumin 5.8 (*)     GFR, Estimated 41 (*)    Anion gap 17 (*)    All other components within normal limits  LIPASE, BLOOD  URINALYSIS, W/ REFLEX TO CULTURE (INFECTION SUSPECTED)  URINE DRUG SCREEN    EKG: EKG Interpretation Date/Time:  Wednesday April 07 2024 11:24:58 EST Ventricular Rate:  102 PR Interval:  146 QRS Duration:  72 QT Interval:  386 QTC Calculation: 503 R Axis:   88  Text Interpretation: Sinus tachycardia Right atrial enlargement Nonspecific ST and T wave abnormalities Compared with prior EKG from 11/19/2023 Confirmed by Gennaro Bouchard ([id number]) on 04/07/2024 12:23:51 PM  Radiology: No results found.   .Critical Care  Performed by: Gennaro Bouchard CROME, DO Authorized by: Gennaro Bouchard CROME, DO   Critical care provider statement:    Critical care time (minutes):  30   Critical care time was exclusive of:  Separately billable procedures and treating other patients and teaching time   Critical care was necessary to treat or prevent imminent or life-threatening deterioration of the following conditions:  Dehydration   Critical care was time spent personally by me on the following activities:  Development of treatment plan with patient or surrogate, evaluation of patient's response to treatment, examination of patient, ordering and performing treatments and interventions, ordering and review of laboratory studies, re-evaluation of patient's condition, review of old charts and obtaining history from patient or surrogate    Medications Ordered in the ED  ondansetron  (ZOFRAN -ODT) disintegrating tablet 4 mg (4 mg Oral Given 04/07/24 0322)  lactated ringers  bolus 1,000 mL (0 mLs Intravenous Stopped 04/07/24 1142)  droperidol  (INAPSINE ) 2.5 MG/ML injection 1.25 mg (1.25 mg Intravenous Given 04/07/24 1042)  ketorolac  (TORADOL ) 15 MG/ML injection 15 mg (15 mg Intravenous Given 04/07/24 1043)  potassium chloride  (KLOR-CON ) packet 20 mEq (20 mEq Oral Given 04/07/24 1043)  lactated ringers  bolus 1,000  mL (1,000 mLs Intravenous New Bag/Given 04/07/24 1145)                                    Medical Decision Making Social determinants of health: Marijuana abuse  Patient here for weight loss and dehydration in the setting of cannabis hyperemesis.  He does have AKI and hypokalemia.  Was able to tolerate p.o. after 1 bag of fluids, droperidol  and Toradol  and feeling better.  I replaced his potassium.  We did discuss stopping cannabis and increasing his fluid intake.  He did not want to wait here for second bag  of fluids and left prior to receiving discharge instructions.  His sister was here with him.  Problems Addressed: AKI (acute kidney injury): acute illness or injury Cannabis hyperemesis syndrome: acute illness or injury Dehydration: acute illness or injury Hypokalemia: acute illness or injury  Amount and/or Complexity of Data Reviewed External Data Reviewed: notes.    Details: Prior ED records reviewed patient has been seen multiple times in the past for nausea and vomiting Labs: ordered. Decision-making details documented in ED Course.    Details: Ordered and reviewed by me and shows hypokalemia, dehydration, AKI ECG/medicine tests: ordered and independent interpretation performed. Decision-making details documented in ED Course.    Details: Ordered and interpreted by me in the absence of cardiology and shows sinus rhythm, no STEMI, or significant change when compared to prior EKG  Risk OTC drugs. Prescription drug management. Parenteral controlled substances. Drug therapy requiring intensive monitoring for toxicity. Diagnosis or treatment significantly limited by social determinants of health. Risk Details: CRITICAL CARE Performed by: Duwaine LITTIE Fusi   Total critical care time: 30 minutes  Critical care time was exclusive of separately billable procedures and treating other patients.  Critical care was necessary to treat or prevent imminent or life-threatening  deterioration.  Critical care was time spent personally by me on the following activities: development of treatment plan with patient and/or surrogate as well as nursing, discussions with consultants, evaluation of patient's response to treatment, examination of patient, obtaining history from patient or surrogate, ordering and performing treatments and interventions, ordering and review of laboratory studies, ordering and review of radiographic studies, pulse oximetry and re-evaluation of patient's condition.   Critical Care Total time providing critical care: 30 minutes     Final diagnoses:  Cannabis hyperemesis syndrome  Dehydration  AKI (acute kidney injury)  Hypokalemia    ED Discharge Orders     None          Fusi Duwaine LITTIE, DO 04/07/24 1348  "

## 2024-04-07 NOTE — ED Triage Notes (Signed)
 Pt with nausea, vomiting and generalized abdominal pain x 1 week. Pt also reports increased anxiety

## 2024-04-07 NOTE — ED Notes (Signed)
"  Patient given urine cup  "

## 2024-04-07 NOTE — ED Provider Triage Note (Signed)
 Emergency Medicine Provider Triage Evaluation Note  Jai Steil , a 24 y.o. male  was evaluated in triage.  Pt complains of nausea/vomiting.  This has been ongoing for months now.  Was supposed to have EGD in August but left hospital AMA.  He does admit to marijuana use, last smoked on Tuesday (yesterday).  Review of Systems  Positive: vomiting Negative: fever  Physical Exam  BP (!) 132/98   Pulse 90   Temp 97.6 F (36.4 C) (Oral)   Resp 15   SpO2 99%   Gen:   Awake, no distress   Resp:  Normal effort  MSK:   Moves extremities without difficulty  Other:    Medical Decision Making  Medically screening exam initiated at 3:18 AM.  Appropriate orders placed.  Evaan Canepa was informed that the remainder of the evaluation will be completed by another provider, this initial triage assessment does not replace that evaluation, and the importance of remaining in the ED until their evaluation is complete.  Nausea/vomiting.  Ongoing x months now.  Admits to ongoing mariajuana use.  May have some degree of cannabinoid hyperemesis.  Will plan for labs, UA, UDS.  Zofran  given.   Jarold Olam HERO, PA-C 04/07/24 0320

## 2024-04-21 ENCOUNTER — Inpatient Hospital Stay (HOSPITAL_COMMUNITY)
Admission: EM | Admit: 2024-04-21 | Discharge: 2024-04-23 | DRG: 683 | Disposition: A | Discharge status: Home / Self Care | Payer: MEDICAID | Attending: Family Medicine | Admitting: Family Medicine

## 2024-04-21 ENCOUNTER — Encounter (HOSPITAL_COMMUNITY): Payer: Self-pay

## 2024-04-21 ENCOUNTER — Other Ambulatory Visit: Payer: Self-pay

## 2024-04-21 DIAGNOSIS — R112 Nausea with vomiting, unspecified: Secondary | ICD-10-CM | POA: Diagnosis not present

## 2024-04-21 DIAGNOSIS — Z681 Body mass index (BMI) 19 or less, adult: Secondary | ICD-10-CM

## 2024-04-21 DIAGNOSIS — N179 Acute kidney failure, unspecified: Principal | ICD-10-CM | POA: Diagnosis present

## 2024-04-21 DIAGNOSIS — Z79899 Other long term (current) drug therapy: Secondary | ICD-10-CM

## 2024-04-21 DIAGNOSIS — Z833 Family history of diabetes mellitus: Secondary | ICD-10-CM

## 2024-04-21 DIAGNOSIS — E878 Other disorders of electrolyte and fluid balance, not elsewhere classified: Secondary | ICD-10-CM | POA: Diagnosis present

## 2024-04-21 DIAGNOSIS — E876 Hypokalemia: Secondary | ICD-10-CM | POA: Diagnosis present

## 2024-04-21 DIAGNOSIS — R64 Cachexia: Secondary | ICD-10-CM | POA: Diagnosis present

## 2024-04-21 DIAGNOSIS — E860 Dehydration: Secondary | ICD-10-CM | POA: Diagnosis present

## 2024-04-21 DIAGNOSIS — I10 Essential (primary) hypertension: Secondary | ICD-10-CM | POA: Diagnosis present

## 2024-04-21 DIAGNOSIS — R627 Adult failure to thrive: Secondary | ICD-10-CM | POA: Diagnosis present

## 2024-04-21 DIAGNOSIS — F419 Anxiety disorder, unspecified: Secondary | ICD-10-CM | POA: Diagnosis present

## 2024-04-21 DIAGNOSIS — R1116 Cannabis hyperemesis syndrome: Secondary | ICD-10-CM | POA: Diagnosis present

## 2024-04-21 DIAGNOSIS — R9431 Abnormal electrocardiogram [ECG] [EKG]: Secondary | ICD-10-CM | POA: Diagnosis present

## 2024-04-21 DIAGNOSIS — E46 Unspecified protein-calorie malnutrition: Secondary | ICD-10-CM | POA: Diagnosis present

## 2024-04-21 DIAGNOSIS — Z8249 Family history of ischemic heart disease and other diseases of the circulatory system: Secondary | ICD-10-CM

## 2024-04-21 DIAGNOSIS — E871 Hypo-osmolality and hyponatremia: Secondary | ICD-10-CM | POA: Diagnosis present

## 2024-04-21 LAB — URINE DRUG SCREEN
Amphetamines: NEGATIVE
Barbiturates: NEGATIVE
Benzodiazepines: NEGATIVE
Cocaine: NEGATIVE
Fentanyl: NEGATIVE
Methadone Scn, Ur: NEGATIVE
Opiates: NEGATIVE
Tetrahydrocannabinol: POSITIVE — AB

## 2024-04-21 LAB — CBC WITH DIFFERENTIAL/PLATELET
Abs Immature Granulocytes: 0.03 10*3/uL (ref 0.00–0.07)
Basophils Absolute: 0 10*3/uL (ref 0.0–0.1)
Basophils Relative: 0 %
Eosinophils Absolute: 0 10*3/uL (ref 0.0–0.5)
Eosinophils Relative: 0 %
HCT: 46.5 % (ref 39.0–52.0)
Hemoglobin: 17.2 g/dL — ABNORMAL HIGH (ref 13.0–17.0)
Immature Granulocytes: 1 %
Lymphocytes Relative: 40 %
Lymphs Abs: 2.6 10*3/uL (ref 0.7–4.0)
MCH: 28.3 pg (ref 26.0–34.0)
MCHC: 37 g/dL — ABNORMAL HIGH (ref 30.0–36.0)
MCV: 76.5 fL — ABNORMAL LOW (ref 80.0–100.0)
Monocytes Absolute: 0.6 10*3/uL (ref 0.1–1.0)
Monocytes Relative: 9 %
Neutro Abs: 3.2 10*3/uL (ref 1.7–7.7)
Neutrophils Relative %: 50 %
Platelets: 380 10*3/uL (ref 150–400)
RBC: 6.08 MIL/uL — ABNORMAL HIGH (ref 4.22–5.81)
RDW: 10.8 % — ABNORMAL LOW (ref 11.5–15.5)
WBC: 6.5 10*3/uL (ref 4.0–10.5)
nRBC: 0 % (ref 0.0–0.2)

## 2024-04-21 LAB — URINALYSIS, ROUTINE W REFLEX MICROSCOPIC
Bilirubin Urine: NEGATIVE
Glucose, UA: 50 mg/dL — AB
Hgb urine dipstick: NEGATIVE
Ketones, ur: NEGATIVE mg/dL
Leukocytes,Ua: NEGATIVE
Nitrite: NEGATIVE
Protein, ur: 100 mg/dL — AB
Specific Gravity, Urine: 1.02 (ref 1.005–1.030)
pH: 6 (ref 5.0–8.0)

## 2024-04-21 LAB — COMPREHENSIVE METABOLIC PANEL WITH GFR
ALT: 15 U/L (ref 0–44)
AST: 27 U/L (ref 15–41)
Albumin: 5.2 g/dL — ABNORMAL HIGH (ref 3.5–5.0)
Alkaline Phosphatase: 65 U/L (ref 38–126)
Anion gap: 13 (ref 5–15)
BUN: 38 mg/dL — ABNORMAL HIGH (ref 6–20)
CO2: 41 mmol/L — ABNORMAL HIGH (ref 22–32)
Calcium: 10.8 mg/dL — ABNORMAL HIGH (ref 8.9–10.3)
Chloride: 67 mmol/L — ABNORMAL LOW (ref 98–111)
Creatinine, Ser: 1.44 mg/dL — ABNORMAL HIGH (ref 0.61–1.24)
GFR, Estimated: >60 mL/min
Glucose, Bld: 133 mg/dL — ABNORMAL HIGH (ref 70–99)
Potassium: 2.3 mmol/L — CL (ref 3.5–5.1)
Sodium: 121 mmol/L — ABNORMAL LOW (ref 135–145)
Total Bilirubin: 1.1 mg/dL (ref 0.0–1.2)
Total Protein: 8.2 g/dL — ABNORMAL HIGH (ref 6.5–8.1)

## 2024-04-21 LAB — MAGNESIUM: Magnesium: 2.7 mg/dL — ABNORMAL HIGH (ref 1.7–2.4)

## 2024-04-21 LAB — LIPASE, BLOOD: Lipase: 24 U/L (ref 11–51)

## 2024-04-21 MED ORDER — SODIUM CHLORIDE 0.9 % IV BOLUS
1000.0000 mL | Freq: Once | INTRAVENOUS | Status: AC
  Administered 2024-04-21: 1000 mL via INTRAVENOUS

## 2024-04-21 MED ORDER — LORAZEPAM 2 MG/ML IJ SOLN
1.0000 mg | Freq: Once | INTRAMUSCULAR | Status: AC
  Administered 2024-04-21: 1 mg via INTRAVENOUS
  Filled 2024-04-21: qty 1

## 2024-04-21 MED ORDER — SODIUM CHLORIDE 0.9 % IV SOLN: INTRAVENOUS | Status: AC

## 2024-04-21 MED ORDER — LORAZEPAM 2 MG/ML IJ SOLN: 1.0000 mg | Freq: Three times a day (TID) | INTRAMUSCULAR | Status: DC | PRN

## 2024-04-21 MED ORDER — PANTOPRAZOLE SODIUM 40 MG IV SOLR
40.0000 mg | Freq: Every day | INTRAVENOUS | Status: DC
  Administered 2024-04-21 – 2024-04-22 (×2): 40 mg via INTRAVENOUS
  Filled 2024-04-21 (×2): qty 10

## 2024-04-21 MED ORDER — ACETAMINOPHEN 325 MG PO TABS: 650.0000 mg | ORAL_TABLET | Freq: Four times a day (QID) | ORAL | Status: DC | PRN

## 2024-04-21 MED ORDER — ACETAMINOPHEN 650 MG RE SUPP: 650.0000 mg | Freq: Four times a day (QID) | RECTAL | Status: DC | PRN

## 2024-04-21 MED ORDER — HEPARIN SODIUM (PORCINE) 5000 UNIT/ML IJ SOLN
5000.0000 [IU] | Freq: Three times a day (TID) | INTRAMUSCULAR | Status: DC
  Administered 2024-04-21 – 2024-04-22 (×4): 5000 [IU] via SUBCUTANEOUS
  Filled 2024-04-21 (×5): qty 1

## 2024-04-21 MED ORDER — POTASSIUM CHLORIDE 10 MEQ/100ML IV SOLN
10.0000 meq | INTRAVENOUS | Status: AC
  Administered 2024-04-21 (×4): 10 meq via INTRAVENOUS
  Filled 2024-04-21 (×4): qty 100

## 2024-04-21 MED ORDER — DOCUSATE SODIUM 100 MG PO CAPS
100.0000 mg | ORAL_CAPSULE | Freq: Two times a day (BID) | ORAL | Status: DC
  Administered 2024-04-21: 100 mg via ORAL
  Filled 2024-04-21 (×2): qty 1

## 2024-04-21 NOTE — ED Triage Notes (Addendum)
 Pt BIBA from home, Unable to eat/drink past 3 weeks. Pt reports abdominal pain, N&V  x 3 weeks. Pt states he feels extremely weak. Hx Anxiety.    EMS vitals  112/76 BP 116 HR 98% RA 191 CBG

## 2024-04-21 NOTE — H&P (Addendum)
 " History and Physical    Bobby Morris FMW:[id number] DOB: 2001/03/22 DOA: 04/21/2024  PCP: [facility]   Patient coming from: Home  I have personally briefly reviewed patient's old medical records in [facility]  Chief Complaint: Intractable nausea and vomiting.  HPI: Bobby Morris is  23 y.o. male with PMH significant for recurrent nausea and vomiting secondary to cannabis use, anxiety disorder presented in the ED with intractable nausea and vomiting for few days.  Patient reports he has cut back on his marijuana use, He denies any recent triggers, and he is unable to keep anything down.  He reports vomiting happens after he eats anything, denies any blood or bile in the vomiting. Vomitus mostly consists of food particles. He denies any abdominal pain. He also reports that he has lost about 30 pounds since he has been last seen.  He feels like he is dehydrated and feels lightheaded when he stands up.  Patient also reports some intermittent shortness of breath with exertion. He denies any chest pain.  Patient was admitted in in the month of August with similar complaint and at that time patient was advised to have a EGD but he left AMA.  Patient also refused to have a EGD at this time.  Patient has smoked marijuana last night.  He denies any sick contacts or recent travel.   ED Course: Patient is hemodynamically stable except tachycardia and hypertension. Temp 97.9, HR 105, RR 18, BP 134/90, SpO2 99% on room air. Labs include sodium 121, potassium 2.3, chloride 67, bicarb 41, glucose 133, BUN 38, creatinine 1.44, calcium 10.8, anion gap 13, magnesium  2.7, alkaline phosphatase 65, albumin 5.2, lipase 24, AST 27, ALT 15, total protein 8.2, total bilirubin 1.1, WBC 6.5, hemoglobin 17.2, hematocrit 46.5, MCV 76.5, platelets 380, UA unremarkable, Urine drug screen positive for THC, CT abdomen pelvis not completed as patient had recent CT scan which was not  significant.  Patient was admitted for further evaluation and started on IV hydration.  Review of Systems:   Review of Systems  Constitutional: Negative.   HENT: Negative.    Eyes: Negative.   Respiratory: Negative.    Cardiovascular: Negative.   Gastrointestinal:  Positive for nausea and vomiting.  Genitourinary: Negative.   Musculoskeletal: Negative.   Skin: Negative.   Neurological:  Positive for dizziness.  Endo/Heme/Allergies: Negative.   Psychiatric/Behavioral: Negative.      Past Medical History:  Diagnosis Date   History of asthma    as a child - no longer on medication   Right ACL tear 01/2017    Past Surgical History:  Procedure Laterality Date   KNEE ARTHROSCOPY WITH ANTERIOR CRUCIATE LIGAMENT (ACL) REPAIR Right 02/19/2017   Procedure: RIGHT ARTHROSCOPY KNEE WITH ANTERIOR CRUCIATE LIGAMENT RECONSTRUCTION WITH ALLOGRAFT.;  Surgeon: Yvone Rush, MD;  Location: [facility];  Service: Orthopedics;  Laterality: Right;     reports that he has never smoked. He has never used smokeless tobacco. He reports that he does not currently use drugs. He reports that he does not drink alcohol.  Allergies[1]  Family History  Problem Relation Age of Onset   Diabetes Maternal Grandmother    Hypertension Maternal Grandmother     Family history reviewed and not pertinent .  Prior to Admission medications  Medication Sig Start Date End Date Taking? Authorizing Provider  dicyclomine  (BENTYL ) 20 MG tablet Take 1 tablet (20 mg total) by mouth 2 (two) times daily. Patient not taking: Reported  on 11/20/2023 06/05/23   Prosperi, Christian H, PA-C  ondansetron  (ZOFRAN -ODT) 8 MG disintegrating tablet Take 1 tablet (8 mg total) by mouth every 8 (eight) hours as needed for nausea or vomiting. 11/09/23   Randol Simmonds, MD  pantoprazole  (PROTONIX ) 20 MG tablet Take 1 tablet (20 mg total) by mouth daily. Patient not taking: Reported on 11/20/2023 06/05/23   Randol Simmonds, MD     Physical Exam: Vitals:   04/21/24 0950 04/21/24 1117 04/21/24 1415 04/21/24 1430  BP:  103/84 117/73 129/87  Pulse:  89 90 90  Resp:  12 11 12   Temp:  98 F (36.7 C)    TempSrc:      SpO2:  100% 100% 100%  Weight: 49.5 kg     Height:        Constitutional: NAD, calm, comfortable, lying in bed. Vitals:   04/21/24 0950 04/21/24 1117 04/21/24 1415 04/21/24 1430  BP:  103/84 117/73 129/87  Pulse:  89 90 90  Resp:  12 11 12   Temp:  98 F (36.7 C)    TempSrc:      SpO2:  100% 100% 100%  Weight: 49.5 kg     Height:       Eyes: PERRL, lids and conjunctivae normal ENMT: Mucous membranes are moist. Posterior pharynx clear of any exudate or lesions. Neck: normal, supple, no masses, no thyromegaly Respiratory: Clear to auscultation bilaterally, no wheezing, no crackles. Normal respiratory effort. No accessory muscle use.  Cardiovascular: S1+ S2 heard, Regular rate and rhythm, no murmurs / rubs / gallops. No extremity edema.  Abdomen: Soft , no tenderness, no masses palpated. No hepatosplenomegaly. Bowel sounds positive.  Musculoskeletal: no clubbing / cyanosis. No joint deformity upper and lower extremities. Good ROM, no contractures.  Skin: no rashes, lesions, ulcers. No induration Neurologic: CN 2-12 grossly intact. Sensation intact, DTR normal. Strength 5/5 in all 4.  Psychiatric: Normal judgment and insight. Alert and oriented x 3. Normal mood.   Labs on Admission: I have personally reviewed following labs and imaging studies  CBC: Recent Labs  Lab 04/21/24 0934  WBC 6.5  NEUTROABS 3.2  HGB 17.2*  HCT 46.5  MCV 76.5*  PLT 380   Basic Metabolic Panel: Recent Labs  Lab 04/21/24 0934  NA 121*  K 2.3*  CL 67*  CO2 41*  GLUCOSE 133*  BUN 38*  CREATININE 1.44*  CALCIUM 10.8*  MG 2.7*   GFR: Estimated Creatinine Clearance: 56.8 mL/min (A) (by C-G formula based on SCr of 1.44 mg/dL (H)). Liver Function Tests: Recent Labs  Lab 04/21/24 0934  AST 27  ALT 15   ALKPHOS 65  BILITOT 1.1  PROT 8.2*  ALBUMIN 5.2*   Recent Labs  Lab 04/21/24 0934  LIPASE 24   No results for input(s): AMMONIA in the last 168 hours. Coagulation Profile: No results for input(s): INR, PROTIME in the last 168 hours. Cardiac Enzymes: No results for input(s): CKTOTAL, CKMB, CKMBINDEX, TROPONINI in the last 168 hours. BNP (last 3 results) No results for input(s): PROBNP in the last 8760 hours. HbA1C: No results for input(s): HGBA1C in the last 72 hours. CBG: No results for input(s): GLUCAP in the last 168 hours. Lipid Profile: No results for input(s): CHOL, HDL, LDLCALC, TRIG, CHOLHDL, LDLDIRECT in the last 72 hours. Thyroid  Function Tests: No results for input(s): TSH, T4TOTAL, FREET4, T3FREE, THYROIDAB in the last 72 hours. Anemia Panel: No results for input(s): VITAMINB12, FOLATE, FERRITIN, TIBC, IRON, RETICCTPCT in the last 72 hours.  Urine analysis:    Component Value Date/Time   COLORURINE YELLOW 04/21/2024 0953   APPEARANCEUR CLEAR 04/21/2024 0953   LABSPEC 1.020 04/21/2024 0953   PHURINE 6.0 04/21/2024 0953   GLUCOSEU 50 (A) 04/21/2024 0953   HGBUR NEGATIVE 04/21/2024 0953   BILIRUBINUR NEGATIVE 04/21/2024 0953   KETONESUR NEGATIVE 04/21/2024 0953   PROTEINUR 100 (A) 04/21/2024 0953   NITRITE NEGATIVE 04/21/2024 0953   LEUKOCYTESUR NEGATIVE 04/21/2024 0953    Radiological Exams on Admission: No results found.  EKG: Independently reviewed.  Sinus rhythm, nonspecific ST-T wave changes.  Assessment/Plan Principal Problem:   Intractable nausea and vomiting Active Problems:   Hypokalemia   Dehydration   QT prolongation   Hyponatremia   Failure to thrive in adult   Intractable nausea and vomiting: Failure to thrive: Patient presented with intractable nausea and vomiting in the setting of marijuana use. Last marijuana use yesterday. He also reported significant weight loss of 30 pounds  in last few months. Admit in telemetry,  Continue IV hydration. Monitor intake / output charting. Continue Ativan  1 mg every 8 hours as needed for nausea vomiting. Hold on Zofran  and Reglan  due to prolonged QT There is no signs of any infection. Start Clear Liquid diet and advance as tolerated. Start Pantoprazole  40 mg daily.  Electrolyte abnormalities( Hyponatremia, Hypokalemia, Hypochloremia) Continue IV hydration, replace potassium,  magnesium ,  calcium. Monitor serum electrolytes. Continue Telemetry.  Prolonged QTc interval: Avoid QT prolonging medications. Hold Reglan , Zofran .  Acute Kidney Injury: Renal functions has improved from prior BMP. Continue IV hydration, Monitor renal functions. Avoid nephrotoxic medications.  Anxiety disorder: Continue Ativan .   DVT prophylaxis: Heparin  sq Code Status: Full code Family Communication: No family at bed side Disposition Plan:   Status is: Observation The patient remains OBS appropriate and will d/c before 2 midnights.   Patient admitted for intractable nausea and vomiting in the setting of hyperemesis syndrome.  Consults called: None Admission status: Observation   Darcel Dawley MD [facility]   If 7PM-7AM, please contact night-coverage   04/21/2024, 3:54 PM        [1] No Known Allergies  "

## 2024-04-21 NOTE — ED Notes (Signed)
 [facility] called for transport.

## 2024-04-21 NOTE — ED Notes (Signed)
 EKG given to Dr. Ula

## 2024-04-21 NOTE — ED Notes (Signed)
 Rate decreased on IV pump for patient's potassium due to increasing burning and pain in his arm.

## 2024-04-21 NOTE — ED Notes (Signed)
 CRITICAL VALUE STICKER  CRITICAL VALUE: Potassium 2.3  RECEIVER (on-site recipient of call): Vandy Fong, Leeroy Crumbly  DATE & TIME NOTIFIED: 10:31 AM  MD NOTIFIED: Curotolo MD  TIME OF NOTIFICATION: 10:31 AM

## 2024-04-21 NOTE — ED Provider Notes (Signed)
 Supervised resident visit.  Patient here with nausea vomiting failure to thrive symptoms last several weeks.  History of hyperemesis from marijuana.  He tried to cut back the best he can.  He is very thin and cachectic on exam.  He was seen here couple weeks ago had elevated creatinine.  Has not really been able to trust himself to really eat or drink much.  He eats very small amounts when he does try to eat per friend in the room with him.  He does have a lot of anxiety about things as well.  He feels very weak and rundown.  Differential diagnosis likely ongoing Hyperemesis concern for dehydration electrolyte abnormalities.  EKG shows sinus rhythm.  Slightly prolonged QT interval.  Will give Ativan  for nausea.  Is not having any abdominal pain at this time.  He had abdominal CT several months ago that was unremarkable.  He is supposed to get EGD colonoscopy at some point sounds like but has not gotten that yet.  He does not have diabetes history.  Seems less likely to be gastroparesis but possible.  Ultimately lab work came back with a potassium of 2.3.  Creatinine appears better than last time but given his ongoing symptoms electrolyte abnormalities mild prolonged QTc I do think he would benefit from observation stay to give him a prolonged p.o. challenge and to replete electrolytes and hydrate him.  Patient mated to medicine for further care.  This chart was dictated using voice recognition software.  Despite best efforts to proofread,  errors can occur which can change the documentation meaning.   .Critical Care  Performed by: Ruthe Cornet, DO Authorized by: Ruthe Cornet, DO   Critical care provider statement:    Critical care time (minutes):  35   Critical care was necessary to treat or prevent imminent or life-threatening deterioration of the following conditions: hypokalemia.   Critical care was time spent personally by me on the following activities:  Blood draw for specimens, development of  treatment plan with patient or surrogate, discussions with primary provider, evaluation of patient's response to treatment, examination of patient, obtaining history from patient or surrogate, ordering and performing treatments and interventions, ordering and review of laboratory studies, ordering and review of radiographic studies, pulse oximetry, re-evaluation of patient's condition and review of old charts   Care discussed with: admitting provider       Ruthe Cornet, DO 04/21/24 1114

## 2024-04-21 NOTE — ED Provider Notes (Signed)
 " [place] EMERGENCY DEPARTMENT AT [facility] Provider Note   CSN: [id number] Arrival date & time: 04/21/24  9181     Patient presents with: Abdominal Pain and Emesis   Bobby Morris is a 24 y.o. male with past medical history of recurrent ED visits for nausea and vomiting along with recurrent cannabis use presents to the ED for nausea and vomiting and inability to tolerate p.o.  He notes that he has had difficulty keeping down any food since January 7.  He does not recall any specific trigger that happened at this time.  He only vomits right after he eats food and does not feel nauseous at any other time.  His emesis contains only the contents of his recently ingested meals.  He denies blood or bile in his emesis.  He has emesis episodes about 3 times a day after he eats.  He has not had a bowel movement since January 7 but he is passing flatus.  He notes that he has lost about 30 pounds to his last seen, saying that he used to weigh about 130 pounds and now he is 30 pounds.  He says he feels like he is dehydrated and feels lightheaded when he standing up.  He does have shortness of breath with exertion.  He does not have any chest pain.  The shortness of breath has been slowly developing over time.  He does have a burning sensation in his epigastric region.  The burning is only present after he eats his food.  He does not have abdominal pain anywhere else.  On past admissions, he had reported some hematemesis as well.  During an admission in August of last year, he was scheduled to have an EGD done but left AMA before this procedure could be done.  He has recurrent ED visits for nausea and vomiting and has been noted to have AKI's in the past.    Abdominal Pain Associated symptoms: vomiting   Emesis Associated symptoms: abdominal pain        Prior to Admission medications  Medication Sig Start Date End Date Taking? Authorizing Provider  dicyclomine  (BENTYL ) 20 MG tablet Take  1 tablet (20 mg total) by mouth 2 (two) times daily. Patient not taking: Reported on 11/20/2023 06/05/23   Prosperi, Christian H, PA-C  ondansetron  (ZOFRAN -ODT) 8 MG disintegrating tablet Take 1 tablet (8 mg total) by mouth every 8 (eight) hours as needed for nausea or vomiting. 11/09/23   Randol Simmonds, MD  pantoprazole  (PROTONIX ) 20 MG tablet Take 1 tablet (20 mg total) by mouth daily. Patient not taking: Reported on 11/20/2023 06/05/23   Randol Simmonds, MD    Allergies: Patient has no known allergies.    Review of Systems  Gastrointestinal:  Positive for abdominal pain and vomiting.    Updated Vital Signs BP 103/84   Pulse 89   Temp 98 F (36.7 C)   Resp 12   Ht 5' 8 (1.727 m)   Wt 49.5 kg   SpO2 100%   BMI 16.59 kg/m   Physical Exam Constitutional:      Comments: undernourished  HENT:     Head:     Comments: Dry mucous membranes Cardiovascular:     Rate and Rhythm: Regular rhythm. Tachycardia present.  Pulmonary:     Effort: Pulmonary effort is normal. No respiratory distress.     Breath sounds: No stridor. No wheezing or rales.  Abdominal:     General: Abdomen is flat. Bowel sounds  are normal. There is no distension. There are no signs of injury.     Palpations: Abdomen is soft.     Tenderness: There is no abdominal tenderness.  Skin:    General: Skin is warm and dry.  Neurological:     Mental Status: He is alert.     (all labs ordered are listed, but only abnormal results are displayed) Labs Reviewed  CBC WITH DIFFERENTIAL/PLATELET - Abnormal; Notable for the following components:      Result Value   RBC 6.08 (*)    Hemoglobin 17.2 (*)    MCV 76.5 (*)    MCHC 37.0 (*)    RDW 10.8 (*)    All other components within normal limits  COMPREHENSIVE METABOLIC PANEL WITH GFR - Abnormal; Notable for the following components:   Sodium 121 (*)    Potassium 2.3 (*)    Chloride 67 (*)    CO2 41 (*)    Glucose, Bld 133 (*)    BUN 38 (*)    Creatinine, Ser 1.44 (*)     Calcium 10.8 (*)    Total Protein 8.2 (*)    Albumin 5.2 (*)    All other components within normal limits  URINALYSIS, ROUTINE W REFLEX MICROSCOPIC - Abnormal; Notable for the following components:   Glucose, UA 50 (*)    Protein, ur 100 (*)    Bacteria, UA RARE (*)    All other components within normal limits  URINE DRUG SCREEN - Abnormal; Notable for the following components:   Tetrahydrocannabinol POSITIVE (*)    All other components within normal limits  MAGNESIUM  - Abnormal; Notable for the following components:   Magnesium  2.7 (*)    All other components within normal limits  LIPASE, BLOOD  CBC  CREATININE, SERUM    EKG: EKG Interpretation Date/Time:  Wednesday April 21 2024 09:44:54 EST Ventricular Rate:  93 PR Interval:  178 QRS Duration:  116 QT Interval:  445 QTC Calculation: 554 R Axis:   93  Text Interpretation: Sinus rhythm Probable left atrial enlargement Nonspecific intraventricular conduction delay ST elev, probable normal early repol pattern Confirmed by Ruthe Cornet [phone]) on 04/21/2024 9:55:58 AM  Radiology: No results found.   Procedures   Medications Ordered in the ED  potassium chloride  10 mEq in 100 mL IVPB (10 mEq Intravenous New Bag/Given 04/21/24 1043)  LORazepam  (ATIVAN ) injection 1 mg (has no administration in time range)  heparin  injection 5,000 Units (has no administration in time range)  0.9 %  sodium chloride  infusion (has no administration in time range)  acetaminophen  (TYLENOL ) tablet 650 mg (has no administration in time range)    Or  acetaminophen  (TYLENOL ) suppository 650 mg (has no administration in time range)  docusate sodium  (COLACE) capsule 100 mg (has no administration in time range)  LORazepam  (ATIVAN ) injection 1 mg (has no administration in time range)  sodium chloride  0.9 % bolus 1,000 mL (0 mLs Intravenous Stopped 04/21/24 1044)                                    Medical Decision Making Patient presents with  recurrent nausea and vomiting when eating.  He has also had weight loss and is cachectic on exam.  He has difficulty keeping any food down at all.  Physical exam largely unremarkable.  He was provided with a liter of fluids with some improvement of his nausea.  EKG ordered which showed elevated QTc.  Patient was given Ativan  for his nausea.  Differential for this patient includes cannabis hyperemesis syndrome, gastric ulcer, delayed gastric emptying, psychogenic vomiting syndrome.  Initial workup demonstrated several electrolyte derangements including hyponatremia to 121 and hypokalemia to 2.3.  He was started on potassium supplementation.  Due to his multiple electrolyte derangements and inability to tolerate p.o. intake, I believe he would benefit from admission.  Discussed case with Dr. Leotis who will admit the patient.  Amount and/or Complexity of Data Reviewed Labs: ordered. Decision-making details documented in ED Course.    Details: Hyponatremia, hypokalemia, metabolic alkalosis, hypochloremia, AKI, proteinuria ECG/medicine tests: ordered and independent interpretation performed.    Details: EKG demonstrated prolonged Qtc, no signs of ischemia Discussion of management or test interpretation with external provider(s): Discussed with Dr. Leotis who will admit the patient  Risk Prescription drug management. Decision regarding hospitalization.       Final diagnoses:  Nausea and vomiting, unspecified vomiting type  Hypokalemia    ED Discharge Orders     None          Napoleon Limes, MD 04/21/24 1140    Ruthe Cornet, DO 04/21/24 1153  "

## 2024-04-22 ENCOUNTER — Other Ambulatory Visit: Payer: Self-pay

## 2024-04-22 DIAGNOSIS — R112 Nausea with vomiting, unspecified: Secondary | ICD-10-CM | POA: Diagnosis not present

## 2024-04-22 LAB — BASIC METABOLIC PANEL WITH GFR
Anion gap: 7 (ref 5–15)
BUN: 14 mg/dL (ref 6–20)
CO2: 32 mmol/L (ref 22–32)
Calcium: 8.2 mg/dL — ABNORMAL LOW (ref 8.9–10.3)
Chloride: 89 mmol/L — ABNORMAL LOW (ref 98–111)
Creatinine, Ser: 1 mg/dL (ref 0.61–1.24)
GFR, Estimated: >60 mL/min
Glucose, Bld: 104 mg/dL — ABNORMAL HIGH (ref 70–99)
Potassium: 3.1 mmol/L — ABNORMAL LOW (ref 3.5–5.1)
Sodium: 128 mmol/L — ABNORMAL LOW (ref 135–145)

## 2024-04-22 LAB — COMPREHENSIVE METABOLIC PANEL WITH GFR
ALT: 11 U/L (ref 0–44)
AST: 23 U/L (ref 15–41)
Albumin: 3.8 g/dL (ref 3.5–5.0)
Alkaline Phosphatase: 45 U/L (ref 38–126)
Anion gap: 7 (ref 5–15)
BUN: 17 mg/dL (ref 6–20)
CO2: 36 mmol/L — ABNORMAL HIGH (ref 22–32)
Calcium: 8.7 mg/dL — ABNORMAL LOW (ref 8.9–10.3)
Chloride: 82 mmol/L — ABNORMAL LOW (ref 98–111)
Creatinine, Ser: 1.09 mg/dL (ref 0.61–1.24)
GFR, Estimated: >60 mL/min
Glucose, Bld: 98 mg/dL (ref 70–99)
Potassium: 2.3 mmol/L — CL (ref 3.5–5.1)
Sodium: 125 mmol/L — ABNORMAL LOW (ref 135–145)
Total Bilirubin: 0.9 mg/dL (ref 0.0–1.2)
Total Protein: 5.7 g/dL — ABNORMAL LOW (ref 6.5–8.1)

## 2024-04-22 LAB — CBC
HCT: 33.5 % — ABNORMAL LOW (ref 39.0–52.0)
Hemoglobin: 12 g/dL — ABNORMAL LOW (ref 13.0–17.0)
MCH: 28.1 pg (ref 26.0–34.0)
MCHC: 35.8 g/dL (ref 30.0–36.0)
MCV: 78.5 fL — ABNORMAL LOW (ref 80.0–100.0)
Platelets: 277 10*3/uL (ref 150–400)
RBC: 4.27 MIL/uL (ref 4.22–5.81)
RDW: 10.8 % — ABNORMAL LOW (ref 11.5–15.5)
WBC: 7.2 10*3/uL (ref 4.0–10.5)
nRBC: 0 % (ref 0.0–0.2)

## 2024-04-22 LAB — MAGNESIUM
Magnesium: 2.1 mg/dL (ref 1.7–2.4)
Magnesium: 2 mg/dL (ref 1.7–2.4)

## 2024-04-22 LAB — PHOSPHORUS
Phosphorus: 1.3 mg/dL — ABNORMAL LOW (ref 2.5–4.6)
Phosphorus: 2.4 mg/dL — ABNORMAL LOW (ref 2.5–4.6)

## 2024-04-22 MED ORDER — SODIUM PHOSPHATES 45 MMOLE/15ML IV SOLN
30.0000 mmol | Freq: Once | INTRAVENOUS | Status: AC
  Administered 2024-04-22: 30 mmol via INTRAVENOUS
  Filled 2024-04-22: qty 10

## 2024-04-22 MED ORDER — INFLUENZA VIRUS VACC SPLIT PF (FLUZONE) 0.5 ML IM SUSY: 0.5000 mL | PREFILLED_SYRINGE | INTRAMUSCULAR | Status: DC | PRN

## 2024-04-22 MED ORDER — POTASSIUM CHLORIDE CRYS ER 20 MEQ PO TBCR
40.0000 meq | EXTENDED_RELEASE_TABLET | Freq: Two times a day (BID) | ORAL | Status: AC
  Administered 2024-04-22 – 2024-04-23 (×3): 40 meq via ORAL
  Filled 2024-04-22 (×4): qty 2

## 2024-04-22 MED ORDER — POTASSIUM CHLORIDE 10 MEQ/100ML IV SOLN
10.0000 meq | INTRAVENOUS | Status: AC
  Administered 2024-04-22 (×5): 10 meq via INTRAVENOUS
  Filled 2024-04-22 (×5): qty 100

## 2024-04-22 NOTE — Progress Notes (Signed)
 " PROGRESS NOTE    Bobby Morris  FMW:[id number] DOB: January 30, 2001 DOA: 04/21/2024 PCP: [facility]   Brief Narrative:  This 24 y.o. male with PMH significant for recurrent nausea and vomiting secondary to cannabis use, anxiety disorder presented in the ED with intractable nausea and vomiting for few days. He reports vomiting happens after he eats anything, denies any blood or bile in the vomiting. Vomitus mostly consists of food particles. He denies any abdominal pain. He also reports that he has lost about 30 pounds since he has been last seen.  He feels like he is dehydrated and feels lightheaded when he stands up.  Patient also reports some intermittent shortness of breath with exertion. Patient was admitted in in the month of August with similar complaint and at that time patient was advised to have a EGD but he left AMA.  Patient also refused to have a EGD at this time.  Patient has smoked marijuana last night.  He is found to have significant electrolyte abnormalities.  Mild AKI and urine drug screen positive for THC.  Patient was admitted for further evaluation.  Assessment & Plan:   Principal Problem:   Intractable nausea and vomiting Active Problems:   Hypokalemia   Dehydration   QT prolongation   Hyponatremia   Failure to thrive in adult   Intractable nausea and vomiting: Failure to thrive: Patient presented with intractable nausea and vomiting in the setting of marijuana use. Last marijuana use one day prior to Admission. He also reports significant weight loss of 30 pounds in last few months. Continue IV hydration. Monitor intake / output charting. Continue Ativan  1 mg every 8 hours as needed for nausea vomiting. Hold on Zofran  and Reglan  due to prolonged QT There is no signs of any infection. Tolerated  Clear Liquid diet and advance as tolerated. Continue Pantoprazole  40 mg daily.   Electrolyte abnormalities( Hyponatremia, Hypokalemia,  Hypochloremia) Continue IV hydration, replace potassium,  magnesium ,  calcium. Monitor serum electrolytes. Continue Telemetry.   Prolonged QTc interval: Avoid QT prolonging medications. Hold Reglan , Zofran .   Acute Kidney Injury: Renal functions has improved from prior BMP. Continue IV hydration, Monitor renal functions. Avoid nephrotoxic medications.   Anxiety disorder: Continue Ativan .  DVT prophylaxis: (Heparin  sq) Code Status: Full code Family Communication: No family at bed side Disposition Plan:     Status is: Inpatient Remains inpatient appropriate because: Patient admitted for intractable nausea and vomiting in setting of hyperemesis syndrome.  Nausea and vomiting improved started on clear liquid diet tolerated well.  Electrolyte still needs to be replaced.  Consultants:  None  Procedures: None  Antimicrobials: None  Subjective: Patient was seen and examined at bedside. Overnight events noted.   Patient reports feeling better, He tolerated clear liquid diet,  wants to have soft diet.   States he is feeling very hungry and he wants to be eat regular food.  Objective: Vitals:   04/22/24 0021 04/22/24 0400 04/22/24 0519 04/22/24 0941  BP: (!) 142/76 129/64 (!) 151/65 (!) 145/83  Pulse: 78 73 84 97  Resp: 13 18 14 18   Temp: 98.5 F (36.9 C)  98.6 F (37 C)   TempSrc: Oral  Oral   SpO2: 99% 99% 100% 99%  Weight:      Height:        Intake/Output Summary (Last 24 hours) at 04/22/2024 1305 Last data filed at 04/22/2024 0523 Gross per 24 hour  Intake 372.05 ml  Output 1240 ml  Net -867.95 ml   Filed Weights   04/21/24 0950  Weight: 49.5 kg    Examination:  General exam: Appears calm and comfortable , not in any acute distress. Respiratory system: Clear to auscultation. Respiratory effort normal. RR 14 Cardiovascular system: S1 & S2 heard, RRR. No JVD, murmurs, rubs, gallops or clicks.  Gastrointestinal system: Abdomen is nondistended, soft and  nontender. Normal bowel sounds heard. Central nervous system: Alert and oriented X 3. No focal neurological deficits. Extremities: No edema, No cyanosis, No clubbing  Skin: No rashes, lesions or ulcers Psychiatry: Judgement and insight appear normal. Mood & affect appropriate.     Data Reviewed: I have personally reviewed following labs and imaging studies  CBC: Recent Labs  Lab 04/21/24 0934 04/22/24 0513  WBC 6.5 7.2  NEUTROABS 3.2  --   HGB 17.2* 12.0*  HCT 46.5 33.5*  MCV 76.5* 78.5*  PLT 380 277   Basic Metabolic Panel: Recent Labs  Lab 04/21/24 0934 04/22/24 0513  NA 121* 125*  K 2.3* 2.3*  CL 67* 82*  CO2 41* 36*  GLUCOSE 133* 98  BUN 38* 17  CREATININE 1.44* 1.09  CALCIUM 10.8* 8.7*  MG 2.7* 2.1  PHOS  --  1.3*   GFR: Estimated Creatinine Clearance: 75.1 mL/min (by C-G formula based on SCr of 1.09 mg/dL). Liver Function Tests: Recent Labs  Lab 04/21/24 0934 04/22/24 0513  AST 27 23  ALT 15 11  ALKPHOS 65 45  BILITOT 1.1 0.9  PROT 8.2* 5.7*  ALBUMIN 5.2* 3.8   Recent Labs  Lab 04/21/24 0934  LIPASE 24   No results for input(s): AMMONIA in the last 168 hours. Coagulation Profile: No results for input(s): INR, PROTIME in the last 168 hours. Cardiac Enzymes: No results for input(s): CKTOTAL, CKMB, CKMBINDEX, TROPONINI in the last 168 hours. BNP (last 3 results) No results for input(s): PROBNP in the last 8760 hours. HbA1C: No results for input(s): HGBA1C in the last 72 hours. CBG: No results for input(s): GLUCAP in the last 168 hours. Lipid Profile: No results for input(s): CHOL, HDL, LDLCALC, TRIG, CHOLHDL, LDLDIRECT in the last 72 hours. Thyroid  Function Tests: No results for input(s): TSH, T4TOTAL, FREET4, T3FREE, THYROIDAB in the last 72 hours. Anemia Panel: No results for input(s): VITAMINB12, FOLATE, FERRITIN, TIBC, IRON, RETICCTPCT in the last 72 hours. Sepsis Labs: No results  for input(s): PROCALCITON, LATICACIDVEN in the last 168 hours.  No results found for this or any previous visit (from the past 240 hours).   Radiology Studies: No results found.  Scheduled Meds:  docusate sodium   100 mg Oral BID   heparin   5,000 Units Subcutaneous Q8H   pantoprazole  (PROTONIX ) IV  40 mg Intravenous Daily   potassium chloride   40 mEq Oral BID   Continuous Infusions:  potassium chloride  10 mEq (04/22/24 1217)   sodium phosphate  30 mmol in sodium chloride  0.9 % 250 mL infusion 30 mmol (04/22/24 0848)     LOS: 1 day    Time spent: 50 mins    Darcel Dawley, MD [facility]   If 7PM-7AM, please contact night-coverage  "

## 2024-04-22 NOTE — ED Notes (Signed)
 Pt noted to be saline locked at time of initial assessment. IVF started at this time see mar

## 2024-04-22 NOTE — Progress Notes (Signed)
 Pt removed both PIVs and stated he no longer needed them. He refuses to have another placed. Hospitalists notified, pt educated.

## 2024-04-23 MED ORDER — PANTOPRAZOLE SODIUM 40 MG PO TBEC: 40.0000 mg | DELAYED_RELEASE_TABLET | Freq: Every day | ORAL | 0 refills | Status: AC

## 2024-04-23 MED ORDER — POTASSIUM CHLORIDE 20 MEQ PO PACK
40.0000 meq | PACK | Freq: Once | ORAL | Status: AC
  Administered 2024-04-23: 40 meq via ORAL
  Filled 2024-04-23: qty 2

## 2024-04-23 NOTE — Plan of Care (Signed)
   Problem: Education: Goal: Knowledge of General Education information will improve Description: Including pain rating scale, medication(s)/side effects and non-pharmacologic comfort measures Outcome: Progressing   Problem: Safety: Goal: Ability to remain free from injury will improve Outcome: Progressing   Problem: Skin Integrity: Goal: Risk for impaired skin integrity will decrease Outcome: Progressing

## 2024-04-23 NOTE — Progress Notes (Signed)
 Dressed, belongings packed, ambulated to front entrance, instructions reviewed with understanding verbalized, letter given for court, K medicine received upon leaving.

## 2024-04-23 NOTE — Discharge Instructions (Signed)
 Advised to follow up PCP in one week. Advised to take pantoprazole 40 mg daily.

## 2024-04-23 NOTE — Plan of Care (Signed)

## 2024-04-23 NOTE — Discharge Summary (Signed)
 Physician Discharge Summary  Bobby Morris FMW:[id number] DOB: 11-Mar-2001 DOA: 04/21/2024  PCP: [facility]  Admit date: 04/21/2024  Discharge date: 04/23/2024  Admitted From: Home  Disposition:  Home  Recommendations for Outpatient Follow-up:  Follow up with PCP in 1-2 weeks. Please obtain BMP/CBC in one week. Advised to take pantoprazole  40 mg daily. Patient has court date today and wants to leave.  Home Health:None Equipment/Devices:None  Discharge Condition: Stable CODE STATUS:Full code Diet recommendation: Heart Healthy   Brief [facility] Course: This 24 y.o. male with PMH significant for recurrent nausea and vomiting secondary to cannabis use, anxiety disorder presented in the ED with intractable nausea and vomiting for few days. He reports vomiting happens after he eats anything, denies any blood or bile in the vomiting. Vomitus mostly consists of food particles. He denies any abdominal pain. He also reports that he has lost about 30 pounds since he has been last seen.  He feels like he is dehydrated and feels lightheaded when he stands up.  Patient also reports some intermittent shortness of breath with exertion. Patient was admitted in in the month of August with similar complaint and at that time patient was advised to have a EGD but he left AMA.  Patient also refused to have a EGD at this time.  Patient has smoked marijuana last night.  He is found to have significant electrolyte abnormalities.  Mild AKI and urine drug screen positive for THC.  Patient was admitted for further evaluation.  Patient was started on IV hydration, has made significant improvement, electrolytes improved.  He tolerated clear liquid diet , advanced to soft which he tolerated well,  denies any nausea and vomiting. Patient has a court date today and he wants to be discharged.  Patient  still has potassium 3.1, He was given 40 p.o. potassium.  Patient being discharged  home.  Discharge Diagnoses:  Principal Problem:   Intractable nausea and vomiting Active Problems:   Hypokalemia   Dehydration   QT prolongation   Hyponatremia   Failure to thrive in adult  Intractable nausea and vomiting: Failure to thrive: Patient presented with intractable nausea and vomiting in the setting of marijuana use. Last marijuana use one day prior to Admission. He also reports significant weight loss of 30 pounds in last few months. Continue IV hydration. Monitor intake / output charting. Continue Ativan  1 mg every 8 hours as needed for nausea vomiting. Hold on Zofran  and Reglan  due to prolonged QT There is no signs of any infection. Tolerated  Clear Liquid diet and soft diet. Continue Pantoprazole  40 mg daily. Nausea vomiting improved.  He wants to be discharged.   Electrolyte abnormalities( Hyponatremia, Hypokalemia, Hypochloremia) Continue IV hydration, replace potassium,  magnesium ,  calcium. Monitor serum electrolytes. Continue Telemetry. Electrolytes improved.   Prolonged QTc interval: Avoid QT prolonging medications. Hold Reglan , Zofran .   Acute Kidney Injury: > Resolved. Renal functions has improved from prior BMP. Continue IV hydration, Monitor renal functions. Avoid nephrotoxic medications.   Anxiety disorder: Continue Ativan .  Discharge Instructions  Discharge Instructions     Call MD for:  difficulty breathing, headache or visual disturbances   Complete by: As directed    Call MD for:  persistant nausea and vomiting   Complete by: As directed    Diet general   Complete by: As directed    Discharge instructions   Complete by: As directed    Advised to follow up PCP in one week. Advised to  take regular diet and potassium chloride .   Increase activity slowly   Complete by: As directed       Allergies as of 04/23/2024   No Known Allergies      Medication List     STOP taking these medications    ondansetron  8 MG  disintegrating tablet Commonly known as: ZOFRAN -ODT       TAKE these medications    pantoprazole  40 MG tablet Commonly known as: Protonix  Take 1 tablet (40 mg total) by mouth daily.        Follow-up Information     [facility] Follow up in 1 week(s).   Specialty: Pediatrics Contact information: [address] [place] [address] [phone]                Allergies[1]  Consultations: None   Procedures/Studies: No results found.  Subjective: Patient was seen and examined at bedside.  Overnight events noted. Patient reports feeling much improved and he wants to be discharged home.   Nausea and vomiting has improved.  He has a court date today,  he has to be there.  Discharge Exam: Vitals:   04/23/24 0141 04/23/24 0613  BP: 128/69 122/78  Pulse: 85 74  Resp: 14 14  Temp: 98.6 F (37 C) 98.4 F (36.9 C)  SpO2: 100% 100%   Vitals:   04/22/24 1751 04/22/24 2141 04/23/24 0141 04/23/24 0613  BP: 121/77 122/63 128/69 122/78  Pulse: 89 91 85 74  Resp: 16 14 14 14   Temp: 98.9 F (37.2 C) 98.1 F (36.7 C) 98.6 F (37 C) 98.4 F (36.9 C)  TempSrc: Oral Oral Oral Oral  SpO2: 100% 100% 100% 100%  Weight:      Height:        General: Pt is alert, awake, not in acute distress Cardiovascular: RRR, S1/S2 +, no rubs, no gallops Respiratory: CTA bilaterally, no wheezing, no rhonchi Abdominal: Soft, NT, ND, bowel sounds + Extremities: no edema, no cyanosis    The results of significant diagnostics from this hospitalization (including imaging, microbiology, ancillary and laboratory) are listed below for reference.     Microbiology: No results found for this or any previous visit (from the past 240 hours).   Labs: BNP (last 3 results) No results for input(s): BNP in the last 8760 hours. Basic Metabolic Panel: Recent Labs  Lab 04/21/24 0934 04/22/24 0513 04/22/24 1445  NA 121* 125* 128*  K 2.3* 2.3* 3.1*   CL 67* 82* 89*  CO2 41* 36* 32  GLUCOSE 133* 98 104*  BUN 38* 17 14  CREATININE 1.44* 1.09 1.00  CALCIUM 10.8* 8.7* 8.2*  MG 2.7* 2.1 2.0  PHOS  --  1.3* 2.4*   Liver Function Tests: Recent Labs  Lab 04/21/24 0934 04/22/24 0513  AST 27 23  ALT 15 11  ALKPHOS 65 45  BILITOT 1.1 0.9  PROT 8.2* 5.7*  ALBUMIN 5.2* 3.8   Recent Labs  Lab 04/21/24 0934  LIPASE 24   No results for input(s): AMMONIA in the last 168 hours. CBC: Recent Labs  Lab 04/21/24 0934 04/22/24 0513  WBC 6.5 7.2  NEUTROABS 3.2  --   HGB 17.2* 12.0*  HCT 46.5 33.5*  MCV 76.5* 78.5*  PLT 380 277   Cardiac Enzymes: No results for input(s): CKTOTAL, CKMB, CKMBINDEX, TROPONINI in the last 168 hours. BNP: Invalid input(s): POCBNP CBG: No results for input(s): GLUCAP in the last 168 hours. D-Dimer No results  for input(s): DDIMER in the last 72 hours. Hgb A1c No results for input(s): HGBA1C in the last 72 hours. Lipid Profile No results for input(s): CHOL, HDL, LDLCALC, TRIG, CHOLHDL, LDLDIRECT in the last 72 hours. Thyroid  function studies No results for input(s): TSH, T4TOTAL, T3FREE, THYROIDAB in the last 72 hours.  Invalid input(s): FREET3 Anemia work up No results for input(s): VITAMINB12, FOLATE, FERRITIN, TIBC, IRON, RETICCTPCT in the last 72 hours. Urinalysis    Component Value Date/Time   COLORURINE YELLOW 04/21/2024 0953   APPEARANCEUR CLEAR 04/21/2024 0953   LABSPEC 1.020 04/21/2024 0953   PHURINE 6.0 04/21/2024 0953   GLUCOSEU 50 (A) 04/21/2024 0953   HGBUR NEGATIVE 04/21/2024 0953   BILIRUBINUR NEGATIVE 04/21/2024 0953   KETONESUR NEGATIVE 04/21/2024 0953   PROTEINUR 100 (A) 04/21/2024 0953   NITRITE NEGATIVE 04/21/2024 0953   LEUKOCYTESUR NEGATIVE 04/21/2024 0953   Sepsis Labs Recent Labs  Lab 04/21/24 0934 04/22/24 0513  WBC 6.5 7.2   Microbiology No results found for this or any previous visit (from the past 240  hours).   Time coordinating discharge: Over 30 minutes  SIGNED:   Darcel Dawley, MD  [facility] 04/23/2024, 12:41 PM Pager   If 7PM-7AM, please contact night-coverage     [1] No Known Allergies

## 2024-07-21 ENCOUNTER — Ambulatory Visit: Payer: MEDICAID | Admitting: "Endocrinology
